# Patient Record
Sex: Female | Born: 1955 | Race: White | Hispanic: No | Marital: Single | Smoking: Current every day smoker
Health system: Southern US, Community
[De-identification: ages and names within clinical notes are randomized; demographics above are authoritative.]

## PROBLEM LIST (undated history)

## (undated) DIAGNOSIS — F419 Anxiety disorder, unspecified: Secondary | ICD-10-CM

## (undated) DIAGNOSIS — I639 Cerebral infarction, unspecified: Secondary | ICD-10-CM

## (undated) DIAGNOSIS — M542 Cervicalgia: Secondary | ICD-10-CM

## (undated) DIAGNOSIS — I6529 Occlusion and stenosis of unspecified carotid artery: Secondary | ICD-10-CM

## (undated) DIAGNOSIS — H539 Unspecified visual disturbance: Secondary | ICD-10-CM

## (undated) DIAGNOSIS — Z720 Tobacco use: Secondary | ICD-10-CM

## (undated) DIAGNOSIS — I10 Essential (primary) hypertension: Secondary | ICD-10-CM

## (undated) DIAGNOSIS — E785 Hyperlipidemia, unspecified: Secondary | ICD-10-CM

## (undated) HISTORY — PX: TONSILLECTOMY: SUR1361

## (undated) HISTORY — DX: Essential (primary) hypertension: I10

## (undated) HISTORY — DX: Cerebral infarction, unspecified: I63.9

## (undated) HISTORY — DX: Unspecified visual disturbance: H53.9

## (undated) HISTORY — PX: TUBAL LIGATION: SHX77

## (undated) HISTORY — DX: Occlusion and stenosis of unspecified carotid artery: I65.29

## (undated) HISTORY — DX: Hyperlipidemia, unspecified: E78.5

## (undated) HISTORY — DX: Tobacco use: Z72.0

## (undated) HISTORY — DX: Cervicalgia: M54.2

## (undated) HISTORY — DX: Anxiety disorder, unspecified: F41.9

---

## 2003-08-04 ENCOUNTER — Other Ambulatory Visit: Admission: RE | Admit: 2003-08-04 | Discharge: 2003-08-04 | Payer: Self-pay | Admitting: Obstetrics and Gynecology

## 2003-08-15 ENCOUNTER — Emergency Department (HOSPITAL_COMMUNITY): Admission: EM | Admit: 2003-08-15 | Discharge: 2003-08-15 | Payer: Self-pay | Admitting: Emergency Medicine

## 2005-11-03 ENCOUNTER — Other Ambulatory Visit: Admission: RE | Admit: 2005-11-03 | Discharge: 2005-11-03 | Payer: Self-pay | Admitting: Obstetrics and Gynecology

## 2009-09-04 DIAGNOSIS — H539 Unspecified visual disturbance: Secondary | ICD-10-CM

## 2009-09-04 HISTORY — DX: Unspecified visual disturbance: H53.9

## 2013-02-02 DIAGNOSIS — I639 Cerebral infarction, unspecified: Secondary | ICD-10-CM

## 2013-02-02 HISTORY — DX: Cerebral infarction, unspecified: I63.9

## 2013-04-04 ENCOUNTER — Encounter: Payer: Self-pay | Admitting: Surgery

## 2013-04-04 ENCOUNTER — Other Ambulatory Visit: Payer: Self-pay | Admitting: *Deleted

## 2013-04-07 ENCOUNTER — Encounter: Payer: Self-pay | Admitting: Surgery

## 2013-04-07 ENCOUNTER — Ambulatory Visit (INDEPENDENT_AMBULATORY_CARE_PROVIDER_SITE_OTHER): Payer: Medicare Other | Admitting: Surgery

## 2013-04-07 ENCOUNTER — Other Ambulatory Visit (INDEPENDENT_AMBULATORY_CARE_PROVIDER_SITE_OTHER): Payer: Medicare Other | Admitting: *Deleted

## 2013-04-07 DIAGNOSIS — I6529 Occlusion and stenosis of unspecified carotid artery: Secondary | ICD-10-CM

## 2013-04-07 NOTE — Progress Notes (Signed)
Vascular and Vein Specialist of [facility]   Patient name: Leah Wolf MRN: [id number] DOB: 08-Sep-1955 Sex: female   Referred by: Dr. Latanya Maudlin  Reason for referral:  Chief Complaint  Patient presents with  . New Evaluation    New bilateral carotid stenosis  referred by Dr Romeo Apple [facility] [place]    HISTORY OF PRESENT ILLNESS: The patient comes in today for evaluation of her carotid disease. She presented to [facility] in early June with symptoms of slurred speech severe headache in vision changes. She was diagnosed with an acute infarct in the left posterior parietal and left occipital lobe. There was diffuse narrowing throughout the left ICA was significant stenosis in the distal left ICA and absence of signal in the left MCA. This was all the MRI. She went on to have a CT scan which showed diffuse abnormality within the left carotid system. There may be an elongated plaque or probable old dissection. He was greater than 90% diameter stenosis of the left common carotid artery and origin of the left internal carotid artery with greater than 99% stenosis. Because of the dissection, the patient was started on anticoagulation. She was also started on a statin for hypercholesterolemia. Since her discharge, she has residual defects in her vision, however these are improved from her initial presentation. She also has trouble with her finding. Her speech is somewhat slurred but improved. She will occasionally get bilateral headaches. She continues to smoke but is trying to stop.  Past Medical History  Diagnosis Date  . Anxiety   . Cervical pain (neck)   . Carotid artery occlusion     left  . Hypertension   . Hyperlipidemia   . Acute CVA (cerebrovascular accident)   . Tobacco abuse     Past Surgical History  Procedure Laterality Date  . Tonsillectomy    . Tubal ligation      History   Social History  . Marital Status: Single    Spouse Name: N/A    Number of  Children: N/A  . Years of Education: N/A   Occupational History  . Not on file.   Social History Main Topics  . Smoking status: Current Every Day Smoker -- 40 years    Types: Cigarettes  . Smokeless tobacco: Never Used  . Alcohol Use: No  . Drug Use: No  . Sexually Active: Not on file   Other Topics Concern  . Not on file   Social History Narrative  . No narrative on file    Family History  Problem Relation Age of Onset  . Heart disease Mother   . Heart disease Brother     Allergies as of 04/07/2013  . (Not on File)    Current Outpatient Prescriptions on File Prior to Visit  Medication Sig Dispense Refill  . aspirin 81 MG tablet Take 81 mg by mouth daily.      Marland Kitchen atorvastatin (LIPITOR) 80 MG tablet Take 80 mg by mouth daily.      Marland Kitchen lisinopril (PRINIVIL,ZESTRIL) 20 MG tablet Take 20 mg by mouth daily.      . Rivaroxaban (XARELTO) 20 MG TABS Take 20 mg by mouth daily.       No current facility-administered medications on file prior to visit.     REVIEW OF SYSTEMS: Please see history of present illness, otherwise all systems are negative  PHYSICAL EXAMINATION: General: The patient appears their stated age.  Vital signs are BP 139/76  Pulse 90  Resp 18  Ht 5\' 4"  (1.626 m)  Wt 165 lb (74.844 kg)  BMI 28.31 kg/m2 HEENT:  No gross abnormalities Pulmonary: Respirations are non-labored Abdomen: Soft and non-tender  Musculoskeletal: There are no major deformities.   Neurologic: Trouble with word finding, slurred speech Skin: There are no ulcer or rashes noted. Psychiatric: The patient has normal affect. Cardiovascular: There is a regular rate and rhythm without significant murmur appreciated. No carotid bruits  Diagnostic Studies: Please see history of present illness for details of her imaging studies. She had a carotid ultrasound which was ordered and reviewed today. This shows a 40-59% bilateral carotid stenosis. There is a less than 50% stenosis within the left  common carotid artery.    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 Since the CT scan mentioned a possibility of dissection, and she was treated with anticoagulation I wonder if she has had some remolding of a dissection which is why her ultrasound findings are not as severe as a CT scan suggested back in June. Before making a decision as to whether or not we should proceed with intervention or what kind of intervention she needs, I feel that I must repeat some for diagnostic imaging. I have elected to repeat her CT angiogram rather than formal angiography because she is on anticoagulation. She will have this done in followup to see me in one week. At that time I will make a decision as to whether or not she needs intervention and what kind of intervention it should be.     Jorge Ny, M.D. [facility] of [place] Office: [phone] Pager:  [phone]

## 2013-04-11 ENCOUNTER — Encounter: Payer: Self-pay | Admitting: Surgery

## 2013-04-14 ENCOUNTER — Ambulatory Visit (INDEPENDENT_AMBULATORY_CARE_PROVIDER_SITE_OTHER): Payer: Medicare Other | Admitting: Surgery

## 2013-04-14 ENCOUNTER — Ambulatory Visit
Admission: RE | Admit: 2013-04-14 | Discharge: 2013-04-14 | Disposition: A | Discharge status: Home / Self Care | Payer: Medicare Other | Source: Ambulatory Visit | Attending: Surgery | Admitting: Surgery

## 2013-04-14 ENCOUNTER — Encounter: Payer: Self-pay | Admitting: *Deleted

## 2013-04-14 ENCOUNTER — Other Ambulatory Visit: Payer: Self-pay | Admitting: *Deleted

## 2013-04-14 ENCOUNTER — Other Ambulatory Visit: Payer: Self-pay | Admitting: Surgery

## 2013-04-14 ENCOUNTER — Encounter: Payer: Self-pay | Admitting: Surgery

## 2013-04-14 DIAGNOSIS — Z01810 Encounter for preprocedural cardiovascular examination: Secondary | ICD-10-CM

## 2013-04-14 DIAGNOSIS — I6529 Occlusion and stenosis of unspecified carotid artery: Secondary | ICD-10-CM

## 2013-04-14 MED ORDER — IOHEXOL 350 MG/ML SOLN
100.0000 mL | Freq: Once | INTRAVENOUS | Status: AC | PRN
  Administered 2013-04-14: 100 mL via INTRAVENOUS

## 2013-04-14 NOTE — Progress Notes (Signed)
The patient comes back today for discussions of her CT scan. Please see note from last week's clinic visit. There have been no interval changes. I spent approximately 30 minutes reviewing the patient's CAT scan and discussing her treatment options with her today.  Her CAT scan today reveals significant stenosis, approximately 90% within the left carotid artery. There is also an area in the proximal left common carotid artery with a 50% stenosis. No significant right carotid stenosis is identified. This does not appear to be a dissection based on today's imaging.  After the above images I discussed proceeding with left carotid endarterectomy. I do not think that she would be a good stenting candidate because of the size mismatch and the small size of her distal internal carotid artery. We have scheduled her left carotid endarterectomy for Thursday, August 21. She will stop her Lesia Hausen on Monday, although she is not taking it consistently because he gets her headaches. In addition she is only periodically taking her statin her statin. We discussed the risks and benefits of surgery which include but are not limited to the risk of stroke, cardiopulmonary complications, nerve injury. She is willing to proceed. At the time of her operation I will need to focus on both the common carotid lesion as well as the internal carotid lesion.

## 2013-04-15 ENCOUNTER — Encounter (HOSPITAL_COMMUNITY): Payer: Self-pay | Admitting: Pharmacy Technician

## 2013-04-15 LAB — CREATININE, SERUM: Creat: 0.64 mg/dL (ref 0.50–1.10)

## 2013-04-15 LAB — BUN: BUN: 12 mg/dL (ref 6–23)

## 2013-04-16 NOTE — Addendum Note (Signed)
Addended by: Sharee Pimple on: 04/16/2013 01:18 PM   Modules accepted: Orders

## 2013-04-21 ENCOUNTER — Ambulatory Visit (HOSPITAL_BASED_OUTPATIENT_CLINIC_OR_DEPARTMENT_OTHER): Payer: Medicare Other | Admitting: Radiology

## 2013-04-21 ENCOUNTER — Ambulatory Visit (HOSPITAL_COMMUNITY)
Admission: RE | Admit: 2013-04-21 | Discharge: 2013-04-21 | Disposition: A | Discharge status: Home / Self Care | Payer: Medicare Other | Source: Ambulatory Visit | Attending: Anesthesiology | Admitting: Anesthesiology

## 2013-04-21 ENCOUNTER — Encounter (HOSPITAL_COMMUNITY)
Admission: RE | Admit: 2013-04-21 | Discharge: 2013-04-21 | Disposition: A | Discharge status: Home / Self Care | Payer: Medicare Other | Source: Ambulatory Visit | Attending: Surgery | Admitting: Surgery

## 2013-04-21 DIAGNOSIS — Z01812 Encounter for preprocedural laboratory examination: Secondary | ICD-10-CM | POA: Insufficient documentation

## 2013-04-21 DIAGNOSIS — R0602 Shortness of breath: Secondary | ICD-10-CM

## 2013-04-21 DIAGNOSIS — Z01818 Encounter for other preprocedural examination: Secondary | ICD-10-CM | POA: Insufficient documentation

## 2013-04-21 DIAGNOSIS — Z01810 Encounter for preprocedural cardiovascular examination: Secondary | ICD-10-CM

## 2013-04-21 DIAGNOSIS — I10 Essential (primary) hypertension: Secondary | ICD-10-CM | POA: Insufficient documentation

## 2013-04-21 LAB — COMPREHENSIVE METABOLIC PANEL
ALT: 32 U/L (ref 0–35)
Calcium: 10 mg/dL (ref 8.4–10.5)
GFR calc Af Amer: >90 mL/min (ref >90)
Glucose, Bld: 112 mg/dL — ABNORMAL HIGH (ref 70–99)
Sodium: 137 mEq/L (ref 135–145)
Total Protein: 7.8 g/dL (ref 6.0–8.3)

## 2013-04-21 LAB — CBC
Hemoglobin: 15.2 g/dL — ABNORMAL HIGH (ref 12.0–15.0)
MCH: 31.6 pg (ref 26.0–34.0)
MCHC: 34.9 g/dL (ref 30.0–36.0)

## 2013-04-21 LAB — URINALYSIS, ROUTINE W REFLEX MICROSCOPIC
Bilirubin Urine: NEGATIVE
Glucose, UA: NEGATIVE mg/dL
Hgb urine dipstick: NEGATIVE
Ketones, ur: NEGATIVE mg/dL
Nitrite: NEGATIVE
Protein, ur: NEGATIVE mg/dL
Specific Gravity, Urine: 1.019 (ref 1.005–1.030)
Urobilinogen, UA: 1 mg/dL (ref 0.0–1.0)
pH: 6.5 (ref 5.0–8.0)

## 2013-04-21 LAB — ABO/RH: ABO/RH(D): A POS

## 2013-04-21 LAB — TYPE AND SCREEN
ABO/RH(D): A POS
Antibody Screen: NEGATIVE

## 2013-04-21 LAB — SURGICAL PCR SCREEN
MRSA, PCR: NEGATIVE
Staphylococcus aureus: NEGATIVE

## 2013-04-21 LAB — APTT: aPTT: 25 s (ref 24–37)

## 2013-04-21 LAB — URINE MICROSCOPIC-ADD ON

## 2013-04-21 LAB — PROTIME-INR: Prothrombin Time: 12.4 seconds (ref 11.6–15.2)

## 2013-04-21 MED ORDER — TECHNETIUM TC 99M SESTAMIBI GENERIC - CARDIOLITE
33.0000 | Freq: Once | INTRAVENOUS | Status: AC | PRN
  Administered 2013-04-21: 33 via INTRAVENOUS

## 2013-04-21 MED ORDER — TECHNETIUM TC 99M SESTAMIBI GENERIC - CARDIOLITE
11.0000 | Freq: Once | INTRAVENOUS | Status: AC | PRN
  Administered 2013-04-21: 11 via INTRAVENOUS

## 2013-04-21 MED ORDER — REGADENOSON 0.4 MG/5ML IV SOLN
0.4000 mg | Freq: Once | INTRAVENOUS | Status: AC
  Administered 2013-04-21: 0.4 mg via INTRAVENOUS

## 2013-04-21 NOTE — Pre-Procedure Instructions (Signed)
Leah Wolf  04/21/2013   Your procedure is scheduled on:  Thursday, April 24, 2013  Report to [facility] at 7:30 AM.  Call this number if you have problems the morning of surgery: [phone]   Remember:   Do not eat food or drink liquids after midnight.   Take these medicines the morning of surgery with A SIP OF WATER: LORazepam (ATIVAN) 2 MG tablet   Do not wear jewelry, make-up or nail polish.  Do not wear lotions, powders, or perfumes. You may wear deodorant.  Do not shave 48 hours prior to surgery. Men may shave face and neck.  Do not bring valuables to the hospital.  [facility] is not responsible  for any belongings or valuables.  Contacts, dentures or bridgework may not be worn into surgery.  Leave suitcase in the car. After surgery it may be brought to your room.  For patients admitted to the hospital, checkout time is 11:00 AM the day of discharge.   Patients discharged the day of surgery will not be allowed to drive home.  Name and phone number of your driver:   Special Instructions: Shower using CHG 2 nights before surgery and the night before surgery.  If you shower the day of surgery use CHG.  Use special wash - you have one bottle of CHG for all showers.  You should use approximately 1/3 of the bottle for each shower.   Please read over the following fact sheets that you were given: Pain Booklet, Coughing and Deep Breathing, Blood Transfusion Information, MRSA Information and Surgical Site Infection Prevention

## 2013-04-21 NOTE — Progress Notes (Signed)
[facility] SITE 3 NUCLEAR MED [address] [place], [place] [address] [phone]    Cardiology Nuclear Med Study  Leah Wolf is a 57 y.o. female     MRN : [id number]     DOB: Apr 05, 1956  Procedure Date: 04/21/2013  Nuclear Med Background Indication for Stress Test:  Evaluation for Ischemia, and Surgical Clearance for  (L) Carotid Endarterectomy on 04-24-13 by Dr.Vance Brabham  History:  No prior known history of CAD, 04-2013: CT Carotid 90% (L) ICA Cardiac Risk Factors: Carotid Disease, CVA, Family History - CAD, Hypertension, Lipids and Smoker  Symptoms: Upper back pain with indigestion, Diaphoresis, Dizziness, DOE, Light-Headedness and Rapid HR   Nuclear Pre-Procedure Caffeine/Decaff Intake:  None> 12 hrs NPO After: 8:00pm   Lungs:  clear O2 Sat: 97% on room air. IV 0.9% NS with Angio Cath:  22g  IV Site: R Wrist x 1, tolerated well IV Started by:  Irean Hong, RN  Chest Size (in):  40 Cup Size: D  Height: 5\' 4"  (1.626 m)  Weight:  164 lb (74.39 kg)  BMI:  Body mass index is 28.14 kg/(m^2). Tech Comments:  Took Lisinopril and Ativan this am    Nuclear Med Study 1 or 2 day study: 1 day  Stress Test Type:  Lexiscan  Reading MD: Willa Rough, MD  Order Authorizing Provider:  Coral Else, MD  Resting Radionuclide: Technetium 80m Sestamibi  Resting Radionuclide Dose: 11.0 mCi   Stress Radionuclide:  Technetium 85m Sestamibi  Stress Radionuclide Dose: 33.0 mCi           Stress Protocol Rest HR: 84 Stress HR: 122  Rest BP: 94/62 Stress BP: 160/69  Exercise Time (min): n/a METS: n/a   Predicted Max HR: 162 bpm % Max HR: 75.31 bpm Rate Pressure Product: 40102   Dose of Adenosine (mg):  n/a Dose of Lexiscan: 0.4 mg  Dose of Atropine (mg): n/a Dose of Dobutamine: n/a mcg/kg/min (at max HR)  Stress Test Technologist: Irean Hong, RN  Nuclear Technologist:  Doyne Keel, CNMT     Rest Procedure:  Myocardial perfusion imaging was performed at rest 45  minutes following the intravenous administration of Technetium 88m Sestamibi. Rest ECG: NSR - Normal EKG  Stress Procedure:  The patient received IV Lexiscan 0.4 mg over 15-seconds.  Technetium 20m Sestamibi injected at 30-seconds. The patient complained of increase heart rate, and headache with Lexiscan, but denied chest pain. Quantitative spect images were obtained after a 45 minute delay. Stress ECG: No significant change from baseline ECG  QPS Raw Data Images:  Normal; no motion artifact; normal heart/lung ratio. Stress Images:  Normal homogeneous uptake in all areas of the myocardium. Rest Images:  Normal homogeneous uptake in all areas of the myocardium. Subtraction (SDS):  No evidence of ischemia. Transient Ischemic Dilatation (Normal <1.22):  NA Lung/Heart Ratio (Normal <0.45):  0.40  Quantitative Gated Spect Images QGS EDV:  39 ml QGS ESV:  10 ml  Impression Exercise Capacity:  Lexiscan with no exercise. BP Response:  Normal blood pressure response. Clinical Symptoms:  No significant symptoms noted. ECG Impression:  No significant ST segment change suggestive of ischemia. Comparison with Prior Nuclear Study: No previous nuclear study performed  Overall Impression:  Normal stress nuclear study. There no significant abnormalities. There is no scar or ischemia. This is a low-risk scan.  LV Ejection Fraction: 73%.  LV Wall Motion:  Normal Wall Motion.  Willa Rough, MD

## 2013-04-23 ENCOUNTER — Encounter: Payer: Self-pay | Admitting: Psychiatry

## 2013-04-23 MED ORDER — DEXTROSE 5 % IV SOLN
1.5000 g | INTRAVENOUS | Status: AC
  Administered 2013-04-24: 1.5 g via INTRAVENOUS
  Filled 2013-04-23: qty 1.5

## 2013-04-24 ENCOUNTER — Inpatient Hospital Stay (HOSPITAL_COMMUNITY)
Admission: RE | Admit: 2013-04-24 | Discharge: 2013-04-25 | DRG: 039 | Disposition: A | Discharge status: Home / Self Care | Payer: Medicare Other | Source: Ambulatory Visit | Attending: Surgery | Admitting: Surgery

## 2013-04-24 ENCOUNTER — Encounter (HOSPITAL_COMMUNITY): Payer: Self-pay | Admitting: *Deleted

## 2013-04-24 ENCOUNTER — Encounter (HOSPITAL_COMMUNITY)
Admission: RE | Disposition: A | Discharge status: Home / Self Care | Payer: Self-pay | Source: Ambulatory Visit | Attending: Surgery

## 2013-04-24 ENCOUNTER — Encounter (HOSPITAL_COMMUNITY): Payer: Self-pay | Admitting: Certified Registered"

## 2013-04-24 ENCOUNTER — Inpatient Hospital Stay (HOSPITAL_COMMUNITY): Payer: Medicare Other | Admitting: Certified Registered"

## 2013-04-24 DIAGNOSIS — Z79899 Other long term (current) drug therapy: Secondary | ICD-10-CM

## 2013-04-24 DIAGNOSIS — I10 Essential (primary) hypertension: Secondary | ICD-10-CM | POA: Diagnosis present

## 2013-04-24 DIAGNOSIS — E785 Hyperlipidemia, unspecified: Secondary | ICD-10-CM | POA: Diagnosis present

## 2013-04-24 DIAGNOSIS — I658 Occlusion and stenosis of other precerebral arteries: Secondary | ICD-10-CM | POA: Diagnosis present

## 2013-04-24 DIAGNOSIS — Z7982 Long term (current) use of aspirin: Secondary | ICD-10-CM

## 2013-04-24 DIAGNOSIS — I6529 Occlusion and stenosis of unspecified carotid artery: Secondary | ICD-10-CM

## 2013-04-24 DIAGNOSIS — F17200 Nicotine dependence, unspecified, uncomplicated: Secondary | ICD-10-CM | POA: Diagnosis present

## 2013-04-24 DIAGNOSIS — F411 Generalized anxiety disorder: Secondary | ICD-10-CM | POA: Diagnosis present

## 2013-04-24 DIAGNOSIS — I69928 Other speech and language deficits following unspecified cerebrovascular disease: Secondary | ICD-10-CM

## 2013-04-24 HISTORY — PX: ENDARTERECTOMY: SHX5162

## 2013-04-24 LAB — CBC
MCH: 30.8 pg (ref 26.0–34.0)
MCV: 91.2 fL (ref 78.0–100.0)
Platelets: 163 10*3/uL (ref 150–400)
RBC: 3.77 MIL/uL — ABNORMAL LOW (ref 3.87–5.11)
RDW: 14.2 % (ref 11.5–15.5)
WBC: 6.5 10*3/uL (ref 4.0–10.5)

## 2013-04-24 LAB — CREATININE, SERUM
Creatinine, Ser: 0.64 mg/dL (ref 0.50–1.10)
GFR calc Af Amer: >90 mL/min (ref >90)

## 2013-04-24 SURGERY — ENDARTERECTOMY, CAROTID
Anesthesia: General | Site: Neck | Laterality: Left | Wound class: Clean

## 2013-04-24 MED ORDER — HYDROMORPHONE HCL PF 1 MG/ML IJ SOLN
INTRAMUSCULAR | Status: AC
  Filled 2013-04-24: qty 1

## 2013-04-24 MED ORDER — SODIUM CHLORIDE 0.9 % IV SOLN: INTRAVENOUS | Status: DC

## 2013-04-24 MED ORDER — ENOXAPARIN SODIUM 30 MG/0.3ML ~~LOC~~ SOLN
30.0000 mg | SUBCUTANEOUS | Status: DC
  Filled 2013-04-24: qty 0.3

## 2013-04-24 MED ORDER — HYDROMORPHONE HCL PF 1 MG/ML IJ SOLN
0.2500 mg | INTRAMUSCULAR | Status: DC | PRN
Start: 2013-04-24 — End: 2013-04-24
  Administered 2013-04-24 (×4): 0.5 mg via INTRAVENOUS

## 2013-04-24 MED ORDER — ATORVASTATIN CALCIUM 80 MG PO TABS
80.0000 mg | ORAL_TABLET | Freq: Every day | ORAL | Status: DC
  Administered 2013-04-24: 80 mg via ORAL
  Filled 2013-04-24 (×2): qty 1

## 2013-04-24 MED ORDER — MORPHINE SULFATE 2 MG/ML IJ SOLN
2.0000 mg | INTRAMUSCULAR | Status: DC | PRN
  Administered 2013-04-24: 2 mg via INTRAVENOUS
  Filled 2013-04-24: qty 1

## 2013-04-24 MED ORDER — LIDOCAINE HCL (PF) 1 % IJ SOLN
INTRAMUSCULAR | Status: AC
  Filled 2013-04-24: qty 30

## 2013-04-24 MED ORDER — ONDANSETRON HCL 4 MG/2ML IJ SOLN
INTRAMUSCULAR | Status: AC
  Filled 2013-04-24: qty 2

## 2013-04-24 MED ORDER — ACETAMINOPHEN 325 MG PO TABS: 325.0000 mg | ORAL_TABLET | ORAL | Status: DC | PRN

## 2013-04-24 MED ORDER — METOPROLOL TARTRATE 1 MG/ML IV SOLN: 2.0000 mg | INTRAVENOUS | Status: DC | PRN

## 2013-04-24 MED ORDER — ASPIRIN EC 81 MG PO TBEC
81.0000 mg | DELAYED_RELEASE_TABLET | Freq: Every day | ORAL | Status: DC
  Administered 2013-04-24: 81 mg via ORAL
  Filled 2013-04-24 (×2): qty 1

## 2013-04-24 MED ORDER — ROCURONIUM BROMIDE 100 MG/10ML IV SOLN
INTRAVENOUS | Status: DC | PRN
  Administered 2013-04-24: 35 mg via INTRAVENOUS

## 2013-04-24 MED ORDER — LIDOCAINE HCL (CARDIAC) 20 MG/ML IV SOLN
INTRAVENOUS | Status: DC | PRN
  Administered 2013-04-24: 90 mg via INTRAVENOUS

## 2013-04-24 MED ORDER — PHENOL 1.4 % MT LIQD: 1.0000 | OROMUCOSAL | Status: DC | PRN

## 2013-04-24 MED ORDER — DOCUSATE SODIUM 100 MG PO CAPS: 100.0000 mg | ORAL_CAPSULE | Freq: Every day | ORAL | Status: DC

## 2013-04-24 MED ORDER — POTASSIUM CHLORIDE CRYS ER 20 MEQ PO TBCR: 20.0000 meq | EXTENDED_RELEASE_TABLET | Freq: Once | ORAL | Status: AC | PRN

## 2013-04-24 MED ORDER — ENOXAPARIN SODIUM 30 MG/0.3ML ~~LOC~~ SOLN: 30.0000 mg | SUBCUTANEOUS | Status: DC

## 2013-04-24 MED ORDER — BISACODYL 10 MG RE SUPP: 10.0000 mg | Freq: Every day | RECTAL | Status: DC | PRN

## 2013-04-24 MED ORDER — ACETAMINOPHEN 650 MG RE SUPP: 325.0000 mg | RECTAL | Status: DC | PRN

## 2013-04-24 MED ORDER — DOPAMINE-DEXTROSE 3.2-5 MG/ML-% IV SOLN: 3.0000 ug/kg/min | INTRAVENOUS | Status: DC

## 2013-04-24 MED ORDER — SUFENTANIL CITRATE 50 MCG/ML IV SOLN
INTRAVENOUS | Status: DC | PRN
  Administered 2013-04-24 (×2): 10 ug via INTRAVENOUS

## 2013-04-24 MED ORDER — HEMOSTATIC AGENTS (NO CHARGE) OPTIME
TOPICAL | Status: DC | PRN
  Administered 2013-04-24: 1 via TOPICAL

## 2013-04-24 MED ORDER — PROTAMINE SULFATE 10 MG/ML IV SOLN
INTRAVENOUS | Status: DC | PRN
  Administered 2013-04-24: 50 mg via INTRAVENOUS

## 2013-04-24 MED ORDER — HYDRALAZINE HCL 20 MG/ML IJ SOLN: 10.0000 mg | INTRAMUSCULAR | Status: DC | PRN

## 2013-04-24 MED ORDER — OXYCODONE-ACETAMINOPHEN 5-325 MG PO TABS
1.0000 | ORAL_TABLET | ORAL | Status: DC | PRN
  Administered 2013-04-24: 2 via ORAL

## 2013-04-24 MED ORDER — LACTATED RINGERS IV SOLN
INTRAVENOUS | Status: DC | PRN
  Administered 2013-04-24: 09:00:00 via INTRAVENOUS

## 2013-04-24 MED ORDER — ONDANSETRON HCL 4 MG/2ML IJ SOLN
INTRAMUSCULAR | Status: DC | PRN
  Administered 2013-04-24: 4 mg via INTRAVENOUS

## 2013-04-24 MED ORDER — SODIUM CHLORIDE 0.9 % IR SOLN
Status: DC | PRN
  Administered 2013-04-24: 11:00:00

## 2013-04-24 MED ORDER — SODIUM CHLORIDE 0.9 % IV SOLN
INTRAVENOUS | Status: DC
  Administered 2013-04-24 – 2013-04-25 (×3): via INTRAVENOUS

## 2013-04-24 MED ORDER — SODIUM CHLORIDE 0.9 % IV SOLN
10.0000 mg | INTRAVENOUS | Status: DC | PRN
  Administered 2013-04-24: 30 ug/min via INTRAVENOUS

## 2013-04-24 MED ORDER — MAGNESIUM SULFATE 40 MG/ML IJ SOLN
2.0000 g | Freq: Once | INTRAMUSCULAR | Status: AC | PRN
  Filled 2013-04-24: qty 50

## 2013-04-24 MED ORDER — PROPOFOL 10 MG/ML IV BOLUS
INTRAVENOUS | Status: DC | PRN
  Administered 2013-04-24: 200 mg via INTRAVENOUS

## 2013-04-24 MED ORDER — OXYCODONE-ACETAMINOPHEN 5-325 MG PO TABS
ORAL_TABLET | ORAL | Status: AC
  Filled 2013-04-24: qty 2

## 2013-04-24 MED ORDER — ONDANSETRON HCL 4 MG/2ML IJ SOLN
4.0000 mg | Freq: Four times a day (QID) | INTRAMUSCULAR | Status: DC | PRN
  Filled 2013-04-24: qty 2

## 2013-04-24 MED ORDER — 0.9 % SODIUM CHLORIDE (POUR BTL) OPTIME
TOPICAL | Status: DC | PRN
  Administered 2013-04-24: 2000 mL

## 2013-04-24 MED ORDER — ENOXAPARIN SODIUM 30 MG/0.3ML ~~LOC~~ SOLN
30.0000 mg | SUBCUTANEOUS | Status: DC
Start: 2013-04-25 — End: 2013-04-25
  Administered 2013-04-25: 30 mg via SUBCUTANEOUS
  Filled 2013-04-24 (×2): qty 0.3

## 2013-04-24 MED ORDER — ALUM & MAG HYDROXIDE-SIMETH 200-200-20 MG/5ML PO SUSP: 15.0000 mL | ORAL | Status: DC | PRN

## 2013-04-24 MED ORDER — LACTATED RINGERS IV SOLN
Freq: Once | INTRAVENOUS | Status: AC
  Administered 2013-04-24: 09:00:00 via INTRAVENOUS

## 2013-04-24 MED ORDER — ONDANSETRON HCL 4 MG/2ML IJ SOLN
4.0000 mg | Freq: Once | INTRAMUSCULAR | Status: AC | PRN
  Administered 2013-04-24: 4 mg via INTRAVENOUS

## 2013-04-24 MED ORDER — LABETALOL HCL 5 MG/ML IV SOLN: 10.0000 mg | INTRAVENOUS | Status: DC | PRN

## 2013-04-24 MED ORDER — DEXTROSE 5 % IV SOLN
1.5000 g | Freq: Two times a day (BID) | INTRAVENOUS | Status: AC
  Administered 2013-04-24 – 2013-04-25 (×2): 1.5 g via INTRAVENOUS
  Filled 2013-04-24 (×2): qty 1.5

## 2013-04-24 MED ORDER — SODIUM CHLORIDE 0.9 % IV SOLN
500.0000 mL | Freq: Once | INTRAVENOUS | Status: AC | PRN
  Administered 2013-04-24: 500 mL via INTRAVENOUS

## 2013-04-24 MED ORDER — LABETALOL HCL 5 MG/ML IV SOLN
INTRAVENOUS | Status: DC | PRN
  Administered 2013-04-24: 10 mg via INTRAVENOUS

## 2013-04-24 MED ORDER — LISINOPRIL 20 MG PO TABS
20.0000 mg | ORAL_TABLET | Freq: Every day | ORAL | Status: DC
  Filled 2013-04-24 (×2): qty 1

## 2013-04-24 MED ORDER — LORAZEPAM 1 MG PO TABS
2.0000 mg | ORAL_TABLET | ORAL | Status: DC | PRN
  Administered 2013-04-24 (×2): 2 mg via ORAL
  Administered 2013-04-24: 1 mg via ORAL
  Administered 2013-04-25: 2 mg via ORAL
  Filled 2013-04-24 (×4): qty 2

## 2013-04-24 MED ORDER — GUAIFENESIN-DM 100-10 MG/5ML PO SYRP: 15.0000 mL | ORAL_SOLUTION | ORAL | Status: DC | PRN

## 2013-04-24 MED ORDER — PANTOPRAZOLE SODIUM 40 MG PO TBEC: 40.0000 mg | DELAYED_RELEASE_TABLET | Freq: Every day | ORAL | Status: DC

## 2013-04-24 MED ORDER — SENNOSIDES-DOCUSATE SODIUM 8.6-50 MG PO TABS
1.0000 | ORAL_TABLET | Freq: Every evening | ORAL | Status: DC | PRN
  Filled 2013-04-24: qty 1

## 2013-04-24 MED ORDER — HEPARIN SODIUM (PORCINE) 1000 UNIT/ML IJ SOLN
INTRAMUSCULAR | Status: DC | PRN
  Administered 2013-04-24: 6000 [IU] via INTRAVENOUS
  Administered 2013-04-24: 1000 [IU] via INTRAVENOUS

## 2013-04-24 SURGICAL SUPPLY — 53 items
ADH SKN CLS APL DERMABOND .7 (GAUZE/BANDAGES/DRESSINGS) ×4
CANISTER SUCTION 2500CC (MISCELLANEOUS) ×2 IMPLANT
CATH ROBINSON RED A/P 18FR (CATHETERS) ×2 IMPLANT
CATH SUCT 10FR WHISTLE TIP (CATHETERS) ×2 IMPLANT
CLIP TI MEDIUM 24 (CLIP) ×2 IMPLANT
CLIP TI WIDE RED SMALL 24 (CLIP) ×2 IMPLANT
CLOTH BEACON ORANGE TIMEOUT ST (SAFETY) ×2 IMPLANT
COVER SURGICAL LIGHT HANDLE (MISCELLANEOUS) ×2 IMPLANT
CRADLE DONUT ADULT HEAD (MISCELLANEOUS) ×2 IMPLANT
DERMABOND ADVANCED (GAUZE/BANDAGES/DRESSINGS) ×4
DERMABOND ADVANCED .7 DNX12 (GAUZE/BANDAGES/DRESSINGS) ×1 IMPLANT
DRAIN CHANNEL 15F RND FF W/TCR (WOUND CARE) IMPLANT
DRAPE WARM FLUID 44X44 (DRAPE) ×2 IMPLANT
ELECT REM PT RETURN 9FT ADLT (ELECTROSURGICAL) ×2
ELECTRODE REM PT RTRN 9FT ADLT (ELECTROSURGICAL) ×1 IMPLANT
EVACUATOR SILICONE 100CC (DRAIN) IMPLANT
GLOVE BIO SURGEON STRL SZ 6.5 (GLOVE) ×1 IMPLANT
GLOVE BIO SURGEON STRL SZ7.5 (GLOVE) ×1 IMPLANT
GLOVE BIOGEL PI IND STRL 6.5 (GLOVE) IMPLANT
GLOVE BIOGEL PI IND STRL 7.5 (GLOVE) ×1 IMPLANT
GLOVE BIOGEL PI INDICATOR 6.5 (GLOVE) ×2
GLOVE BIOGEL PI INDICATOR 7.5 (GLOVE) ×4
GLOVE SS BIOGEL STRL SZ 7 (GLOVE) IMPLANT
GLOVE SS BIOGEL STRL SZ 7.5 (GLOVE) IMPLANT
GLOVE SUPERSENSE BIOGEL SZ 7 (GLOVE) ×1
GLOVE SUPERSENSE BIOGEL SZ 7.5 (GLOVE) ×1
GLOVE SURG SS PI 7.5 STRL IVOR (GLOVE) ×2 IMPLANT
GOWN PREVENTION PLUS XXLARGE (GOWN DISPOSABLE) ×2 IMPLANT
GOWN STRL NON-REIN LRG LVL3 (GOWN DISPOSABLE) ×7 IMPLANT
HEMOSTAT SNOW SURGICEL 2X4 (HEMOSTASIS) ×1 IMPLANT
INSERT FOGARTY SM (MISCELLANEOUS) IMPLANT
KIT BASIN OR (CUSTOM PROCEDURE TRAY) ×2 IMPLANT
KIT ROOM TURNOVER OR (KITS) ×2 IMPLANT
NS IRRIG 1000ML POUR BTL (IV SOLUTION) ×4 IMPLANT
PACK CAROTID (CUSTOM PROCEDURE TRAY) ×2 IMPLANT
PAD ARMBOARD 7.5X6 YLW CONV (MISCELLANEOUS) ×4 IMPLANT
PATCH VASCULAR VASCU GUARD 1X6 (Vascular Products) ×1 IMPLANT
SHUNT CAROTID BYPASS 10 (VASCULAR PRODUCTS) IMPLANT
SHUNT CAROTID BYPASS 12FRX15.5 (VASCULAR PRODUCTS) IMPLANT
SPONGE INTESTINAL PEANUT (DISPOSABLE) ×1 IMPLANT
SUT ETHILON 3 0 PS 1 (SUTURE) IMPLANT
SUT PROLENE 6 0 BV (SUTURE) ×2 IMPLANT
SUT PROLENE 7 0 BV 1 (SUTURE) ×1 IMPLANT
SUT PROLENE 7 0 BV1 MDA (SUTURE) ×1 IMPLANT
SUT SILK 3 0 TIES 17X18 (SUTURE)
SUT SILK 3-0 18XBRD TIE BLK (SUTURE) IMPLANT
SUT VIC AB 3-0 SH 27 (SUTURE) ×4
SUT VIC AB 3-0 SH 27X BRD (SUTURE) ×2 IMPLANT
SUT VICRYL 4-0 PS2 18IN ABS (SUTURE) ×2 IMPLANT
TOWEL OR 17X24 6PK STRL BLUE (TOWEL DISPOSABLE) ×2 IMPLANT
TOWEL OR 17X26 10 PK STRL BLUE (TOWEL DISPOSABLE) ×2 IMPLANT
TUBE FEEDING 8FR 16IN STR KANG (MISCELLANEOUS) ×1 IMPLANT
WATER STERILE IRR 1000ML POUR (IV SOLUTION) ×2 IMPLANT

## 2013-04-24 NOTE — Progress Notes (Signed)
Pt remains anxious; admin bolus for BP. Will continue to monitor.

## 2013-04-24 NOTE — Transfer of Care (Signed)
Immediate Anesthesia Transfer of Care Note  Patient: Leah Wolf  Procedure(s) Performed: Procedure(s): ENDARTERECTOMY CAROTID with patch angioplasty (Left)  Patient Location: PACU  Anesthesia Type:General  Level of Consciousness: awake, alert  and oriented  Airway & Oxygen Therapy: Patient Spontanous Breathing and Patient connected to nasal cannula oxygen  Post-op Assessment: Report given to PACU RN, Post -op Vital signs reviewed and stable and Patient moving all extremities  Post vital signs: Reviewed and stable  Complications: No apparent anesthesia complications

## 2013-04-24 NOTE — Interval H&P Note (Signed)
History and Physical Interval Note:  04/24/2013 9:42 AM  Sharren Bridge  has presented today for surgery, with the diagnosis of LEFT ICA STENOSIS  The various methods of treatment have been discussed with the patient and family. After consideration of risks, benefits and other options for treatment, the patient has consented to  Procedure(s): ENDARTERECTOMY CAROTID (Left) as a surgical intervention .  The patient's history has been reviewed, patient examined, no change in status, stable for surgery.  I have reviewed the patient's chart and labs.  Questions were answered to the patient's satisfaction.     Nikai Quest IV, Lala Lund  The patient comes back today for discussions of her CT scan. Please see note from last week's clinic visit. There have been no interval changes. I spent approximately 30 minutes reviewing the patient's CAT scan and discussing her treatment options with her today.  Her CAT scan today reveals significant stenosis, approximately 90% within the left carotid artery. There is also an area in the proximal left common carotid artery with a 50% stenosis. No significant right carotid stenosis is identified. This does not appear to be a dissection based on today's imaging.  After the above images I discussed proceeding with left carotid endarterectomy. I do not think that she would be a good stenting candidate because of the size mismatch and the small size of her distal internal carotid artery. We have scheduled her left carotid endarterectomy for Thursday, August 21. She will stop her Lesia Hausen on Monday, although she is not taking it consistently because he gets her headaches. In addition she is only periodically taking her statin her statin. We discussed the risks and benefits of surgery which include but are not limited to the risk of stroke, cardiopulmonary complications, nerve injury. She is willing to proceed. At the time of her operation I will need to focus on both the common carotid  lesion as well as the internal carotid lesion.

## 2013-04-24 NOTE — Progress Notes (Signed)
Utilization review completed.  

## 2013-04-24 NOTE — Preoperative (Signed)
Beta Blockers   Reason not to administer Beta Blockers:Not Applicable 

## 2013-04-24 NOTE — Anesthesia Postprocedure Evaluation (Signed)
  Anesthesia Post-op Note  Patient: Leah Wolf  Procedure(s) Performed: Procedure(s): ENDARTERECTOMY CAROTID with patch angioplasty (Left)  Patient Location: PACU  Anesthesia Type:General  Level of Consciousness: awake, alert , oriented and patient cooperative  Airway and Oxygen Therapy: Patient Spontanous Breathing  Post-op Pain: none  Post-op Assessment: Post-op Vital signs reviewed, Patient's Cardiovascular Status Stable, Respiratory Function Stable, Patent Airway, No signs of Nausea or vomiting and Pain level controlled  Post-op Vital Signs: stable  Complications: No apparent anesthesia complications

## 2013-04-24 NOTE — Progress Notes (Signed)
Pt arrived from PACU, VSS, c/o R hand discomfort. Removed arm board, relieved pain somewhat. Will continue to monitor.

## 2013-04-24 NOTE — OR Nursing (Signed)
Patient displayed equal bilateral strength in hands and feet, tongue midline; preop and post op. Slurred speech pre op and post op.

## 2013-04-24 NOTE — Op Note (Signed)
Vascular and Vein Specialists of [facility]  Patient name: Leah Wolf MRN: [id number] DOB: 01-16-1956 Sex: female  04/24/2013 Pre-operative Diagnosis: Symptomatic   left carotid stenosis Post-operative diagnosis:  Same Surgeon:  Jorge Ny Assistants:  Narda Amber Procedure:    left carotid Endarterectomy with bovine pericardial patch angioplasty Anesthesia:  General Blood Loss:  See anesthesia record Specimens:  Carotid Plaque to pathology  Findings:  90 %stenosis; Thrombus:  none  Indications:  This is a 57 year old female who had a stroke in June. No intervention was performed at that time. She was referred to the [facility] [place] but elected to come to [place]. I saw her approximately 2 weeks ago. I ordered a CT angiogram which showed a high-grade stenosis in her left carotid artery. Because of the diameter of her internal carotid artery, I did not feel she was a stent candidate due to the small size. She therefore comes in for endarterectomy.  Procedure:  The patient was identified in the holding area and taken to [facility] OR ROOM 11  The patient was then placed supine on the table.   General endotrachial anesthesia was administered.  The patient was prepped and draped in the usual sterile fashion.  A time out was called and antibiotics were administered.  The incision was made along the anterior border of the left sternocleidomastoid muscle.  Cautery was used to dissect through the subcutaneous tissue.  The platysma muscle was divided with cautery.  The internal jugular vein was exposed along its anterior medial border.  The common facial vein was exposed and then divided between 2-0 silk ties and metal clips.  The common carotid artery was then circumferentially exposed and encircled with an umbilical tape.  The vagus nerve was identified and protected.  Next sharp dissection was used to expose the external carotid artery and the superior thyroid artery.  The were encircled  with a blue vessel loop and a 2-0 silk tie respectively.  Finally, the internal carotid was carefully dissected free.  An umbilical tape was placed around the internal carotid artery distal to the diseased segment.  The hypoglossal nerve was visualized throughout and protected.  The patient was given systemic heparinization.  A bovine carotid patch was selected and prepared on the back table.  A 10 french and 8 [place] shunt were also prepared.  After blood pressure readings were appropriate and the heparin had been given time to circulate, the internal carotid artery was occluded with a baby Gregory clamp.  The external and common carotid arteries were then occluded with vascular clamps and the 2-0 tie tightened on the superior thyroid artery.  A #11 blade was used to make an arteriotomy in the common carotid artery.  This was extended with Potts scissors along the anterior and lateral border of the common and internal carotid artery.  Approximately 90% stenosis was identified.  There was no thrombus identified.  I tried to place a 52 [place] and an 8 [place] shunt but because of the small size of the artery I could not get the shunt to go into the vessel. There was moderate backbleeding. I did not feel I had any other option but to proceed without a shunt..  A kleiner kuntz elevator was used to perform endarterectomy.  An eversion endarterectomy was performed in the external carotid artery.  A good distal endpoint was obtained in the internal carotid artery.  The specimen was removed and sent to pathology.  Heparinized saline was used to irrigate  the endarterectomized field.  All potential embolic debris was removed.  Bovine pericardial patch angioplasty was then performed using a running 6-0 Prolene.  The common internal and external carotid arteries were all appropriately flushed. The artery was again irrigated with heparin saline.  The anastomosis was then secured. The clamp was first released on the external  carotid artery followed by the common carotid artery approximately 30 seconds later, bloodflow was reestablish through the internal carotid artery.  Next, a hand-held Doppler was used to evaluate the signals in the common, external, and internal  carotid arteries, all of which had appropriate signals. I then administered 50 mg protamine. The wound was then irrigated.  After hemostasis was achieved, the carotid sheath was reapproximated with 3-0 Vicryl. The platysma muscle was reapproximated with running 3-0 Vicryl. The skin was closed with 4-0 Vicryl. Dermabond was placed on the skin. The patient was then successfully extubated. Her neurologic exam was similar to her preprocedural exam.The patient was then taken to recovery room in stable condition. There were no complications.     Disposition:  To PACU in stable condition.  Relevant Operative Details:  The patient had an extremely small internal carotid artery. I tried to advance an 74 [place] shunt however this wasn't too snug to advance. I felt it would be dangerous to place a shunt and therefore I did not. She had moderate backbleeding. A focal stenosis within the bifurcation extending into the internal carotid artery was identified. This was approximately 90%.  Juleen China, M.D. [facility] Specialists of [place] Office: [phone] Pager:  [phone]

## 2013-04-24 NOTE — Anesthesia Preprocedure Evaluation (Signed)
Anesthesia Evaluation  Patient identified by MRN, date of birth, ID band Patient awake    Reviewed: Allergy & Precautions, H&P , NPO status , Patient's Chart, lab work & pertinent test results  Airway       Dental   Pulmonary Current Smoker,          Cardiovascular hypertension, + Peripheral Vascular Disease     Neuro/Psych Anxiety CVA    GI/Hepatic   Endo/Other    Renal/GU      Musculoskeletal   Abdominal   Peds  Hematology   Anesthesia Other Findings Vocal and visual effects fron CVA  Reproductive/Obstetrics                           Anesthesia Physical Anesthesia Plan  ASA: III  Anesthesia Plan: General   Post-op Pain Management:    Induction: Intravenous  Airway Management Planned: Oral ETT  Additional Equipment: Arterial line  Intra-op Plan:   Post-operative Plan: Extubation in OR  Informed Consent: I have reviewed the patients History and Physical, chart, labs and discussed the procedure including the risks, benefits and alternatives for the proposed anesthesia with the patient or authorized representative who has indicated his/her understanding and acceptance.     Plan Discussed with: CRNA, Anesthesiologist and Surgeon  Anesthesia Plan Comments:         Anesthesia Quick Evaluation

## 2013-04-24 NOTE — Consult Note (Signed)
ANTIBIOTIC CONSULT NOTE - INITIAL  Pharmacy Consult for : Zinacef Indication: Post op antibiotic prophylaxsis, pharmacy may adjust.  Allergies:  NKA  CrCl: Estimated Creatinine Clearance: 76.4 ml/min (by C-G formula based on Cr of 0.71).  Antibiotics:   aspirin EC 81 mg Oral Daily  atorvastatin 80 mg Oral Daily  cefUROXime (ZINACEF)  IV 1.5 g Intravenous Q12H  [START ON 04/25/2013] docusate sodium 100 mg Oral Daily  [START ON 04/25/2013] enoxaparin (LOVENOX) injection 30 mg Subcutaneous Q24H  HYDROmorphone     HYDROmorphone     lisinopril 20 mg Oral Daily  ondansetron     oxyCODONE-acetaminophen     pantoprazole 40 mg Oral Daily     Assessment:  57 y/o female s/p L-CEA placed on Zinacef 1.5 gm IV q 12 hours x 2 doses post-op.  CrCl 76 ml/min.  No renal adjustment in antibiotics needed.  Goal of Therapy:   Renal Adjustment of Antibiotics  Plan:   Continue Zinacef as ordered.  Pharmacy will sign off at this time, please call for further questions  Laurena Bering,  Pharm.D.  04/24/2013 4:57 PM

## 2013-04-24 NOTE — H&P (View-Only) (Signed)
[facility] [place]   Patient name: Leah Wolf MRN: [id number] DOB: 03/02/1956 Sex: female   Referred by: Dr. Sweezer  Reason for referral:  Chief Complaint  Patient presents with  . New Evaluation    New bilateral carotid stenosis  referred by Dr William Sweezer [facility] [place]    HISTORY OF PRESENT ILLNESS: The patient comes in today for evaluation of her carotid disease. She presented to [facility] in early June with symptoms of slurred speech severe headache in vision changes. She was diagnosed with an acute infarct in the left posterior parietal and left occipital lobe. There was diffuse narrowing throughout the left ICA was significant stenosis in the distal left ICA and absence of signal in the left MCA. This was all the MRI. She went on to have a CT scan which showed diffuse abnormality within the left carotid system. There may be an elongated plaque or probable old dissection. He was greater than 90% diameter stenosis of the left common carotid artery and origin of the left internal carotid artery with greater than 99% stenosis. Because of the dissection, the patient was started on anticoagulation. She was also started on a statin for hypercholesterolemia. Since her discharge, she has residual defects in her vision, however these are improved from her initial presentation. She also has trouble with her finding. Her speech is somewhat slurred but improved. She will occasionally get bilateral headaches. She continues to smoke but is trying to stop.  Past Medical History  Diagnosis Date  . Anxiety   . Cervical pain (neck)   . Carotid artery occlusion     left  . Hypertension   . Hyperlipidemia   . Acute CVA (cerebrovascular accident)   . Tobacco abuse     Past Surgical History  Procedure Laterality Date  . Tonsillectomy    . Tubal ligation      History   Social History  . Marital Status: Single    Spouse Name: N/A    Number of  Children: N/A  . Years of Education: N/A   Occupational History  . Not on file.   Social History Main Topics  . Smoking status: Current Every Day Smoker -- 40 years    Types: Cigarettes  . Smokeless tobacco: Never Used  . Alcohol Use: No  . Drug Use: No  . Sexually Active: Not on file   Other Topics Concern  . Not on file   Social History Narrative  . No narrative on file    Family History  Problem Relation Age of Onset  . Heart disease Mother   . Heart disease Brother     Allergies as of 04/07/2013  . (Not on File)    Current Outpatient Prescriptions on File Prior to Visit  Medication Sig Dispense Refill  . aspirin 81 MG tablet Take 81 mg by mouth daily.      . atorvastatin (LIPITOR) 80 MG tablet Take 80 mg by mouth daily.      . lisinopril (PRINIVIL,ZESTRIL) 20 MG tablet Take 20 mg by mouth daily.      . Rivaroxaban (XARELTO) 20 MG TABS Take 20 mg by mouth daily.       No current facility-administered medications on file prior to visit.     REVIEW OF SYSTEMS: Please see history of present illness, otherwise all systems are negative  PHYSICAL EXAMINATION: General: The patient appears their stated age.  Vital signs are BP 139/76  Pulse 90  Resp 18    Ht 5' 4" (1.626 m)  Wt 165 lb (74.844 kg)  BMI 28.31 kg/m2 HEENT:  No gross abnormalities Pulmonary: Respirations are non-labored Abdomen: Soft and non-tender  Musculoskeletal: There are no major deformities.   Neurologic: Trouble with word finding, slurred speech Skin: There are no ulcer or rashes noted. Psychiatric: The patient has normal affect. Cardiovascular: There is a regular rate and rhythm without significant murmur appreciated. No carotid bruits  Diagnostic Studies: Please see history of present illness for details of her imaging studies. She had a carotid ultrasound which was ordered and reviewed today. This shows a 40-59% bilateral carotid stenosis. There is a less than 50% stenosis within the left  common carotid artery.    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 Since the CT scan mentioned a possibility of dissection, and she was treated with anticoagulation I wonder if she has had some remolding of a dissection which is why her ultrasound findings are not as severe as a CT scan suggested back in June. Before making a decision as to whether or not we should proceed with intervention or what kind of intervention she needs, I feel that I must repeat some for diagnostic imaging. I have elected to repeat her CT angiogram rather than formal angiography because she is on anticoagulation. She will have this done in followup to see me in one week. At that time I will make a decision as to whether or not she needs intervention and what kind of intervention it should be.     V. Brazie Jo Booze IV, M.D. [facility] [place] Office: [phone] Pager:  [phone]    

## 2013-04-24 NOTE — Anesthesia Procedure Notes (Signed)
Procedure Name: Intubation Date/Time: 04/24/2013 10:15 AM Performed by: Charm Barges, Kacey Vicuna R Pre-anesthesia Checklist: Patient identified, Emergency Drugs available, Suction available, Patient being monitored and Timeout performed Patient Re-evaluated:Patient Re-evaluated prior to inductionOxygen Delivery Method: Circle system utilized Preoxygenation: Pre-oxygenation with 100% oxygen Intubation Type: IV induction Ventilation: Mask ventilation without difficulty Laryngoscope Size: 4 Grade View: Grade II Tube type: Oral Tube size: 7.5 mm Number of attempts: 1 Airway Equipment and Method: Stylet Placement Confirmation: ETT inserted through vocal cords under direct vision and positive ETCO2 Secured at: 21 cm Tube secured with: Tape Dental Injury: Teeth and Oropharynx as per pre-operative assessment

## 2013-04-25 ENCOUNTER — Encounter (HOSPITAL_COMMUNITY): Payer: Self-pay | Admitting: Surgery

## 2013-04-25 LAB — CBC
HCT: 33.1 % — ABNORMAL LOW (ref 36.0–46.0)
MCHC: 33.5 g/dL (ref 30.0–36.0)
MCV: 91.2 fL (ref 78.0–100.0)
Platelets: 155 10*3/uL (ref 150–400)
RDW: 14.1 % (ref 11.5–15.5)
WBC: 7.2 10*3/uL (ref 4.0–10.5)

## 2013-04-25 LAB — BASIC METABOLIC PANEL
BUN: 7 mg/dL (ref 6–23)
Calcium: 8.2 mg/dL — ABNORMAL LOW (ref 8.4–10.5)
Creatinine, Ser: 0.6 mg/dL (ref 0.50–1.10)
GFR calc Af Amer: >90 mL/min (ref >90)
GFR calc non Af Amer: >90 mL/min (ref >90)

## 2013-04-25 MED ORDER — OXYCODONE-ACETAMINOPHEN 5-325 MG PO TABS: 1.0000 | ORAL_TABLET | Freq: Four times a day (QID) | ORAL | Status: DC | PRN

## 2013-04-25 MED ORDER — ASPIRIN 81 MG PO TBEC: 81.0000 mg | DELAYED_RELEASE_TABLET | Freq: Every day | ORAL | Status: DC

## 2013-04-25 MED ORDER — ROSUVASTATIN CALCIUM 5 MG PO TABS: 5.0000 mg | ORAL_TABLET | Freq: Every day | ORAL | Status: DC

## 2013-04-25 NOTE — Discharge Summary (Signed)
[facility] Specialists Discharge Summary   Patient ID:  Leah Wolf MRN: [id number] DOB/AGE: 04-22-1956 57 y.o.  Admit date: 04/24/2013 Discharge date: 04/25/2013 Date of Surgery: 04/24/2013 Surgeon: Surgeon(s): Nada Libman, MD  Admission Diagnosis: LEFT ICA STENOSIS  Discharge Diagnoses:  LEFT ICA STENOSIS  Secondary Diagnoses: Past Medical History  Diagnosis Date  . Anxiety   . Cervical pain (neck)   . Carotid artery occlusion     left  . Hypertension   . Hyperlipidemia   . Acute CVA (cerebrovascular accident)   . Tobacco abuse     Procedure(s): ENDARTERECTOMY CAROTID with patch angioplasty  Discharged Condition: good  HPI: The patient comes in today for evaluation of her carotid disease. She presented to [facility] in early June with symptoms of slurred speech severe headache in vision changes. She was diagnosed with an acute infarct in the left posterior parietal and left occipital lobe. There was diffuse narrowing throughout the left ICA was significant stenosis in the distal left ICA and absence of signal in the left MCA. This was all the MRI. She went on to have a CT scan which showed diffuse abnormality within the left carotid system. There may be an elongated plaque or probable old dissection. He was greater than 90% diameter stenosis of the left common carotid artery and origin of the left internal carotid artery with greater than 99% stenosis. Because of the dissection, the patient was started on anticoagulation. She was also started on a statin for hypercholesterolemia. Since her discharge, she has residual defects in her vision, however these are improved from her initial presentation. She also has trouble with her finding. Her speech is somewhat slurred but improved. She will occasionally get bilateral headaches. She continues to smoke but is trying to stop.   She underwent left carotid endarterectomy 04/24/2013 with an uneventful hospital stay.   No signs of stroke or TIA post-op.   Hospital Course:  Leah Wolf is a 57 y.o. female is S/P Left Procedure(s): ENDARTERECTOMY CAROTID with patch angioplasty Extubated: POD # 0 Physical exam: Pt is A&O x 3  Speech is fluent  left Neck Wound is clean, dry, intact or ecchymotic  Negative weakness UE/LE  Negative tongue deviation  Negative facial droop  Post-op wounds clean, dry, intact or ecchymotic Pt. Ambulating, voiding and taking PO diet without difficulty. Pt pain controlled with PO pain meds. Labs as below Complications:none  Consults:     Significant Diagnostic Studies: CBC Lab Results  Component Value Date   WBC 7.2 04/25/2013   HGB 11.1* 04/25/2013   HCT 33.1* 04/25/2013   MCV 91.2 04/25/2013   PLT 155 04/25/2013    BMET    Component Value Date/Time   NA 136 04/25/2013 0420   K 3.9 04/25/2013 0420   CL 106 04/25/2013 0420   CO2 22 04/25/2013 0420   GLUCOSE 108* 04/25/2013 0420   BUN 7 04/25/2013 0420   CREATININE 0.60 04/25/2013 0420   CREATININE 0.64 04/14/2013 1420   CALCIUM 8.2* 04/25/2013 0420   GFRNONAA >90 04/25/2013 0420   GFRAA >90 04/25/2013 0420   COAG Lab Results  Component Value Date   INR 0.94 04/21/2013     Disposition:  Discharge to :Home Discharge Orders   Future Orders Complete By Expires   Call MD for:  redness, tenderness, or signs of infection (pain, swelling, bleeding, redness, odor or green/yellow discharge around incision site)  As directed    Call MD for:  severe or  increased pain, loss or decreased feeling  in affected limb(s)  As directed    Call MD for:  temperature >100.5  As directed    Driving Restrictions  As directed    Comments:     No driving for 2 weeks   Increase activity slowly  As directed    Comments:     Walk with assistance use walker or cane as needed   Lifting restrictions  As directed    Comments:     No lifting for 6 weeks   May shower   As directed    Resume previous diet  As directed         Medication List    STOP taking these medications       atorvastatin 80 MG tablet  Commonly known as:  LIPITOR      TAKE these medications       aspirin EC 81 MG tablet  Take 81 mg by mouth daily.     aspirin 81 MG EC tablet  Take 1 tablet (81 mg total) by mouth daily.     lisinopril 20 MG tablet  Commonly known as:  PRINIVIL,ZESTRIL  Take 20 mg by mouth daily.     LORazepam 2 MG tablet  Commonly known as:  ATIVAN  Take 2 mg by mouth 4 (four) times daily.     oxyCODONE-acetaminophen 5-325 MG per tablet  Commonly known as:  PERCOCET/ROXICET  Take 1-2 tablets by mouth every 6 (six) hours as needed.     rosuvastatin 5 MG tablet  Commonly known as:  CRESTOR  Take 1 tablet (5 mg total) by mouth daily.       Patient unable to tolerate Lipitor.  We will prescribe Crestor for home 5mg  QD.   Verbal and written Discharge instructions given to the patient. Wound care per Discharge AVS   Signed: Clinton Gallant [facility] 04/25/2013, 8:17 AM  --- For VQI Registry use --- Instructions: Press F2 to tab through selections.  Delete question if not applicable.   Modified Rankin score at D/C (0-6): Rankin Score=0  IV medication needed for:  1. Hypertension: No 2. Hypotension: No  Post-op Complications: No  1. Post-op CVA or TIA: No  If yes: Event classification (right eye, left eye, right cortical, left cortical, verterobasilar, other):   If yes: Timing of event (intra-op, <6 hrs post-op, >=6 hrs post-op, unknown):   2. CN injury: No  If yes: CN  injuried   3. Myocardial infarction: No  If yes: Dx by (EKG or clinical, Troponin):   4.  CHF: No  5.  Dysrhythmia (new): No  6. Wound infection: No  7. Reperfusion symptoms: No  8. Return to OR: No  If yes: return to OR for (bleeding, neurologic, other CEA incision, other):   Discharge medications: Statin use:  Yes ASA use:  Yes Beta blocker use:  No  for medical reason   ACE-Inhibitor use:  No  for medical  reason   P2Y12 Antagonist use: Arly.Keller ] None, [ ]  Plavix, [ ]  Plasugrel, [ ]  Ticlopinine, [ ]  Ticagrelor, [ ]  Other, [ ]  No for medical reason, [ ]  Non-compliant, [ ]  Not-indicated Anti-coagulant use:  Arly.Keller ] None, [ ]  Warfarin, [ ]  Rivaroxaban, [ ]  Dabigatran, [ ]  Other, [ ]  No for medical reason, [ ]  Non-compliant, [ ]  Not-indicated  Zaineb Nowaczyk MAUREEN Pa-c

## 2013-04-25 NOTE — Progress Notes (Addendum)
VASCULAR AND VEIN SURGERY POST - OP CEA NOTE  HPI:.id had a left Carotid Endarterectomy 1 day ago. Patient is doing well.  Patient denies headache; denies difficulty swallowing; denies weakness; denies symptoms of stroke or TIA  Filed Vitals:   04/25/13 0700  BP:   Pulse:   Temp: 99 F (37.2 C)  Resp:    Scheduled Meds: . aspirin EC  81 mg Oral Daily  . atorvastatin  80 mg Oral Daily  . docusate sodium  100 mg Oral Daily  . enoxaparin (LOVENOX) injection  30 mg Subcutaneous Q24H  . lisinopril  20 mg Oral Daily  . pantoprazole  40 mg Oral Daily   Continuous Infusions: . sodium chloride 125 mL/hr at 04/25/13 0452  . DOPamine      Pt is A&O x 3 Speech is fluent left Neck Wound is clean, dry, intact or ecchymotic Negative weakness UE/LE Negative tongue deviation Negative facial droop  Plan: Follow-up in 2 weeks then in 6 months for Carotid Duplex scan  COLLINS, EMMA MAUREEN PA-C I agree with the above. The patient is neurologically intact. She is stable for discharge.  Durene Cal

## 2013-04-25 NOTE — Progress Notes (Signed)
Pt d/c home this am. Reviewed d/c instructions, Rx, etc. Pt and family understood.

## 2013-04-25 NOTE — Progress Notes (Signed)
A LINE D/C PER MD ORDER

## 2013-04-25 NOTE — Discharge Summary (Signed)
I agree with the above. The patient is neurologically intact. She is stable for discharge.  Leah Wolf

## 2013-04-28 ENCOUNTER — Telehealth: Payer: Self-pay | Admitting: *Deleted

## 2013-04-28 NOTE — Telephone Encounter (Signed)
Mr Neale Burly called for Almendra but I talked with Gavin Pound regarding the Percocet increasing her agitation and nervousness. She wanted to know if she could take Advil. In talking with her,she said that actually when she took 2mg  rather than 1mg  of Ativan; she was able to relax and she didn't hurt. I spoke with Dr Myra Gianotti and he said she could not take Advil but if Ativan worked to use that. I told Rigby and said if it didn't help to call us back. She agreed.

## 2013-05-06 ENCOUNTER — Telehealth: Payer: Self-pay

## 2013-05-06 DIAGNOSIS — I10 Essential (primary) hypertension: Secondary | ICD-10-CM

## 2013-05-06 MED ORDER — LISINOPRIL 20 MG PO TABS: 20.0000 mg | ORAL_TABLET | Freq: Every day | ORAL | Status: DC

## 2013-05-06 NOTE — Telephone Encounter (Signed)
Phone call from pt. with request for refill on BP medication, Lisinopril 20 mg.  States she was discharged from the hospital on this medication, and only has 2 pills left.  Advised pt. that Dr. Myra Gianotti doesn't manage her BP medication, and she should call her PCP or cardiologist.  Stated she has trouble getting her phone call answered at her PCP's office, and is afraid she will run out of medication.  Advised pt., this nurse will send a one time refill for Lisinopril 20 mg,1 tab daily; # 30 tabs;  Advised that she definitely needs to call her PCP for future refills.  Pt. verb. understanding.

## 2013-05-12 ENCOUNTER — Ambulatory Visit: Payer: Medicare Other | Admitting: Surgery

## 2013-06-26 ENCOUNTER — Telehealth: Payer: Self-pay

## 2013-06-26 NOTE — Telephone Encounter (Signed)
Pt's friend called to schedule appt. for pt.  Reports she c/o left neck hurting when she turns her head.   Also states she continues to have speech and vision problems.  States pt. never got a follow-up appt. to see Dr. Myra Gianotti after the surgery.  Phone call to pt. to discuss symtoms.  Pt's speech is very slow (previous CVA)  Reports she has "a tightness" in her left neck when she lifts her head up.  Denies any swallowing difficulty.  Denies noticing any redness/ swelling.  Reports having intermittent episodes of visual changes; describes seeing z's in one or both eyes, that lasts a short time.  States that she has had these episodes for about 2 years.  States she was told that the vision change could be related to Migraine headaches.  Pt. denies any weakness of extremities.  Questioned if she is being followed by a medical doctor or neurologist?  States she has a PCP, and saw her approx. 1 mo. ago.   Gave pt. appt. For f/u w/ Dr. Myra Gianotti for 10/27 @ 3:00 PM.  Strongly encouraged her to schedule appt. with her PCP to discuss ongoing problems with her vision.  Verb. Understanding.

## 2013-06-27 ENCOUNTER — Encounter: Payer: Self-pay | Admitting: Surgery

## 2013-06-30 ENCOUNTER — Encounter: Payer: Self-pay | Admitting: Surgery

## 2013-06-30 ENCOUNTER — Ambulatory Visit (HOSPITAL_COMMUNITY)
Admission: RE | Admit: 2013-06-30 | Discharge: 2013-06-30 | Disposition: A | Discharge status: Home / Self Care | Payer: Medicare Other | Source: Ambulatory Visit | Attending: Surgery | Admitting: Surgery

## 2013-06-30 ENCOUNTER — Ambulatory Visit (INDEPENDENT_AMBULATORY_CARE_PROVIDER_SITE_OTHER): Payer: Self-pay | Admitting: Surgery

## 2013-06-30 ENCOUNTER — Other Ambulatory Visit: Payer: Self-pay | Admitting: Surgery

## 2013-06-30 DIAGNOSIS — H539 Unspecified visual disturbance: Secondary | ICD-10-CM

## 2013-06-30 DIAGNOSIS — I7789 Other specified disorders of arteries and arterioles: Secondary | ICD-10-CM | POA: Insufficient documentation

## 2013-06-30 DIAGNOSIS — I6529 Occlusion and stenosis of unspecified carotid artery: Secondary | ICD-10-CM

## 2013-06-30 DIAGNOSIS — Z8673 Personal history of transient ischemic attack (TIA), and cerebral infarction without residual deficits: Secondary | ICD-10-CM | POA: Insufficient documentation

## 2013-06-30 DIAGNOSIS — Z5189 Encounter for other specified aftercare: Secondary | ICD-10-CM

## 2013-06-30 DIAGNOSIS — T819XXD Unspecified complication of procedure, subsequent encounter: Secondary | ICD-10-CM

## 2013-06-30 DIAGNOSIS — R1310 Dysphagia, unspecified: Secondary | ICD-10-CM

## 2013-06-30 NOTE — Progress Notes (Signed)
The patient comes in today for followup. in August of 2014 she underwent a left carotid endarterectomy.  Earlier this summer in [place], she presented with a acute stroke.  I performed a CT angiogram preoperatively which showed a high-grade left carotid artery stenosis.  She was taken to the operating room and this was confirmed, it was approximately 90%.  I was unable to place a 8 [place] shunt because of the small caliber distal internal carotid artery.  Her postoperative course was uncomplicated and she was discharged to home.  The patient did not show for her initial postoperative visit.  She is back today for followup.  She states that she has had 4 episodes since her operation where she sees zig-zag lines in her eyes.  Each episode lasts about 30 minutes and then resolves.  She states that she has had this going on since 2011.  Did resolve for a while but has returned.  She reports no other neurologic symptoms.  On examination, her incision is healing nicely.  Her neurologic exam is back to baseline.  I elected to duplex her carotids today given her symptoms.  A high resistive waveform was identified in the left distal internal carotid artery.  After the ultrasound findings I feel that a better evaluation of her carotid artery needs to be performed since she is having these symptoms.  I contemplated angiography however elected to order a CT angiogram of the head and neck for initial evaluation of her carotid arteries both in the intracranial and extracranial system.  This will be performed within the week and she will followup after.

## 2013-07-01 NOTE — Addendum Note (Signed)
Addended by: Sharee Pimple on: 07/01/2013 08:15 AM   Modules accepted: Orders

## 2013-07-04 ENCOUNTER — Encounter: Payer: Self-pay | Admitting: Surgery

## 2013-07-07 ENCOUNTER — Ambulatory Visit (INDEPENDENT_AMBULATORY_CARE_PROVIDER_SITE_OTHER): Payer: Self-pay | Admitting: Surgery

## 2013-07-07 ENCOUNTER — Inpatient Hospital Stay: Admission: RE | Admit: 2013-07-07 | Payer: Medicare Other | Source: Ambulatory Visit

## 2013-07-07 ENCOUNTER — Ambulatory Visit
Admission: RE | Admit: 2013-07-07 | Discharge: 2013-07-07 | Disposition: A | Discharge status: Home / Self Care | Payer: Medicare Other | Source: Ambulatory Visit | Attending: Surgery | Admitting: Surgery

## 2013-07-07 ENCOUNTER — Encounter: Payer: Self-pay | Admitting: Surgery

## 2013-07-07 DIAGNOSIS — T819XXD Unspecified complication of procedure, subsequent encounter: Secondary | ICD-10-CM

## 2013-07-07 DIAGNOSIS — I6529 Occlusion and stenosis of unspecified carotid artery: Secondary | ICD-10-CM

## 2013-07-07 DIAGNOSIS — Z48812 Encounter for surgical aftercare following surgery on the circulatory system: Secondary | ICD-10-CM

## 2013-07-07 DIAGNOSIS — R1310 Dysphagia, unspecified: Secondary | ICD-10-CM

## 2013-07-07 MED ORDER — IOHEXOL 350 MG/ML SOLN
100.0000 mL | Freq: Once | INTRAVENOUS | Status: AC | PRN
  Administered 2013-07-07: 100 mL via INTRAVENOUS

## 2013-07-07 NOTE — Progress Notes (Signed)
[facility]   Patient name: Leah Wolf MRN: [id number] DOB: 1956/02/26 Sex: female     Chief Complaint  Patient presents with  . Carotid    f/u CTA head/neck prior    HISTORY OF PRESENT ILLNESS: The patient is back for followup.  She is status post left carotid endarterectomy in August of 2014.Earlier this summer in [place], she presented with a acute stroke. I performed a CT angiogram preoperatively which showed a high-grade left carotid artery stenosis. She was taken to the operating room and this was confirmed, it was approximately 90%. I was unable to place a 8 [place] shunt because of the small caliber distal internal carotid artery. Her postoperative course was uncomplicated and she was discharged to home. The patient did not show for her initial postoperative visit.  At her followup last week she describes seeing Z.-zag lines in both eyes.  Each episode lasted approximately 30 minutes and then it resolved.  She did stated this had been going on since 2011.  She has not had any episodes since last week.  She had a high resistance wave form on ultrasound and therefore I elected to get a CT scan.  She is back today to discuss the results.  I have reviewed her CT angiogram with radiology.  We compared to her preoperative study.  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  Her endarterectomy site is widely patent.  Her internal carotid artery on the left is very small.  I discussed these results with the patient.  I do not feel any intervention is required at this time.  I have elected to treat her medically.  I do not see any need to confirm supraclinoid carotid occlusion with catheter angiography.  I did start the patient on Plavix.  She will followup in 6 months.   Jorge Ny,  M.D. [facility] [place] Office: [phone] Pager:  [phone]

## 2013-07-08 NOTE — Addendum Note (Signed)
Addended by: Sharee Pimple on: 07/08/2013 08:26 AM   Modules accepted: Orders

## 2014-01-02 ENCOUNTER — Encounter: Payer: Self-pay | Admitting: Family

## 2014-01-05 ENCOUNTER — Ambulatory Visit (INDEPENDENT_AMBULATORY_CARE_PROVIDER_SITE_OTHER): Payer: Medicare Other | Admitting: Family

## 2014-01-05 ENCOUNTER — Ambulatory Visit (HOSPITAL_COMMUNITY)
Admission: RE | Admit: 2014-01-05 | Discharge: 2014-01-05 | Disposition: A | Discharge status: Home / Self Care | Payer: Medicare Other | Source: Ambulatory Visit | Attending: Family | Admitting: Family

## 2014-01-05 ENCOUNTER — Encounter: Payer: Self-pay | Admitting: Family

## 2014-01-05 VITALS — BP 124/73 | HR 88 | Resp 16 | Ht 64.0 in | Wt 166.0 lb

## 2014-01-05 DIAGNOSIS — I6529 Occlusion and stenosis of unspecified carotid artery: Secondary | ICD-10-CM

## 2014-01-05 DIAGNOSIS — Z48812 Encounter for surgical aftercare following surgery on the circulatory system: Secondary | ICD-10-CM | POA: Insufficient documentation

## 2014-01-05 NOTE — Progress Notes (Signed)
Established Carotid Patient   History of Present Illness  Leah Wolf is a 58 y.o. female patient of Dr. Myra GianottiBrabham who returns today for follow up.  On 07/07/13 visit with Dr. Myra GianottiBrabham:  She is status post left carotid endarterectomy in August of 2014.Earlier this summer in [place], she presented with a acute stroke. I performed a CT angiogram preoperatively which showed a high-grade left carotid artery stenosis. She was taken to the operating room and this was confirmed, it was approximately 90%. I was unable to place a 8 [place] shunt because of the small caliber distal internal carotid artery. Her postoperative course was uncomplicated and she was discharged to home. The patient did not show for her initial postoperative visit. At her followup last week she describes seeing Z.-zag lines in both eyes. Each episode lasted approximately 30 minutes and then it resolved. She did stated this had been going on since 2011. She has not had any episodes since last week. She had a high resistance wave form on ultrasound and therefore I elected to get a CT scan. She is back today to discuss the results.  I have reviewed her CT angiogram with radiology. We compared to her preoperative study. 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 Her endarterectomy site is widely patent. Her internal carotid artery on the left is very small.  I discussed these results with the patient. I do not feel any intervention is required at this time. I have elected to treat her medically. I do not see any need to confirm supraclinoid carotid occlusion with catheter angiography. I did start the patient on Plavix. She will followup in 6 months.   Pt states she had one stroke in June, 2014 as manifested by a "terrible headache" ather  crown, generalized vision disturbance,  denies hemiparesis, denies unilateral facial drooping, did have slurred speech and still has some residual slurred speech issues. She denies any tingling, numbness, or weakness in either arm, denies dizziness. Pt denies claudication symptoms in legs with walking. Pt states she has a bulging lumbar disc and sciatica. She states that she also has c-spine issues and sees a chiropractor for this.   Pt denies New Medical or Surgical History.  Pt Diabetic: No Pt smoker: smoker  (3 cigarettes/day, started smoking in her teens)  Pt meds include: Statin : Yes ASA: Yes Other anticoagulants/antiplatelets: she did not tolerate Plavix, caused severe gastric pain.   Past Medical History  Diagnosis Date  . Anxiety   . Cervical pain (neck)   . Carotid artery occlusion     left  . Hypertension   . Hyperlipidemia   . Tobacco abuse   . Visual aura 2011  . Acute CVA (cerebrovascular accident) June 2014    Social History History  Substance Use Topics  . Smoking status: Current Every Day Smoker -- 40 years    Types: Cigarettes  . Smokeless tobacco: Never Used  . Alcohol Use: No    Family History Family History  Problem Relation Age of Onset  . Heart disease Mother   . Heart attack Mother   . Heart disease Brother     Before age 60  . Hyperlipidemia Brother   . Hypertension Brother   . Heart attack Brother   . Heart disease Brother   . Hyperlipidemia Brother   .  Hypertension Brother   . Heart attack Brother     Surgical History Past Surgical History  Procedure Laterality Date  . Tonsillectomy    . Tubal ligation    . Endarterectomy Left 04/24/2013    Procedure: ENDARTERECTOMY CAROTID with patch angioplasty;  Surgeon: Nada Libman, MD;  Location: [facility] OR;  Service: Vascular;  Laterality: Left;  . Carotid endarterectomy Left Aug. 21, 2015    cea    Allergies  Allergen Reactions  . Percocet [Oxycodone-Acetaminophen] Anxiety    Current Outpatient Prescriptions  Medication  Sig Dispense Refill  . aspirin EC 81 MG EC tablet Take 1 tablet (81 mg total) by mouth daily.  30 tablet  0  . lisinopril (PRINIVIL,ZESTRIL) 20 MG tablet Take 1 tablet (20 mg total) by mouth daily.  30 tablet  0  . LORazepam (ATIVAN) 2 MG tablet Take 2 mg by mouth 4 (four) times daily.       . rosuvastatin (CRESTOR) 5 MG tablet Take 1 tablet (5 mg total) by mouth daily.  30 tablet  2  . aspirin EC 81 MG tablet Take 81 mg by mouth daily.      Marland Kitchen oxyCODONE-acetaminophen (PERCOCET/ROXICET) 5-325 MG per tablet Take 1-2 tablets by mouth every 6 (six) hours as needed.  30 tablet  0   No current facility-administered medications for this visit.    Review of Systems : See HPI for pertinent positives and negatives.  Physical Examination  Filed Vitals:   01/05/14 1210 01/05/14 1215  BP: 144/86 124/73  Pulse: 89 88  Resp:  16  Height:  5\' 4"  (1.626 m)  Weight:  166 lb (75.297 kg)  SpO2:  97%  Body mass index is 28.48 kg/(m^2).   General: WDWN female in NAD GAIT: normal Eyes: PERRLA Pulmonary:  Non-labored, CTAB, Negative  Rales, Negative rhonchi, & Negative wheezing.  Cardiac: regular Rhythm ,  Negative detected murmur.  VASCULAR EXAM Carotid Bruits Left Right   Negative Negative   Radial pulses are 2+ palpable and equal.                                                                                                                            LE Pulses LEFT RIGHT       POPLITEAL  not palpable   not palpable       POSTERIOR TIBIAL   palpable    palpable        DORSALIS PEDIS      ANTERIOR TIBIAL not palpable  not palpable     Gastrointestinal: soft, nontender, BS WNL, no r/g,  negative masses.  Musculoskeletal: Negative muscle atrophy/wasting. M/S 5/5 throughout, Extremities without ischemic changes.  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   Non-Invasive Vascular Imaging CAROTID DUPLEX 01/05/2014   CEREBROVASCULAR DUPLEX EVALUATION    INDICATION: Carotid  artery disease     PREVIOUS INTERVENTION(S): Left carotid endarterectomy with bovine pericardial patch angioplasty 04/24/2013.    DUPLEX EXAM:     RIGHT  LEFT  Peak Systolic Velocities (cm/s) End Diastolic Velocities (cm/s) Plaque LOCATION Peak Systolic Velocities (cm/s) End Diastolic Velocities (cm/s) Plaque  123 42  CCA PROXIMAL 97 21   119 43  CCA MID 125 32 HM  119 43  CCA DISTAL 143 34 HM  165 32  ECA 223 47   141 67 SP ICA PROXIMAL 31 6   123 59  ICA MID 116 35 HT  105 52  ICA DISTAL 66 19     1.03 ICA / CCA Ratio (PSV)   Antegrade  Vertebral Flow Antegrade    Brachial Systolic Pressure (mmHg)    Brachial Artery Waveforms     Plaque Morphology:  HM = Homogeneous, HT = Heterogeneous, CP = Calcific Plaque, SP = Smooth Plaque, IP = Irregular Plaque     ADDITIONAL FINDINGS:     IMPRESSION: Right internal carotid artery velocities are suggestive of a 40-59% stenosis.  Patent left carotid endarterectomy site with evidence of hyperplasia present in the common carotid and proximal internal carotid artery. Elevated velocities noted at the distal patch site with a pre to mid stenosis ratio of 3.7.     Compared to the previous exam:  Stable right internal carotid artery. Left mid internal carotid artery velocities have significantly elevated compared to the last exam on 06/30/2013.      Assessment: Leah Wolf is a 58 y.o. female who is status post left carotid endarterectomy in August of 2014. She has had no stroke or TIA activity since June, 2014. Right internal carotid artery velocities are suggestive of a 40-59% stenosis.  Patent left carotid endarterectomy site with evidence of hyperplasia present in the common carotid and proximal internal carotid artery. Elevated velocities noted at the distal patch site with a pre to mid stenosis ratio of 3.7.   Stable right internal carotid artery. Left mid internal carotid artery velocities have significantly elevated compared to the last exam on 06/30/2013.  Consider intensive statin therapy which is associated with a greater reduction in CVD risk and improved endothelial function , will defer to PCP. She was unable to tolerate Plavix due to gastric irritation.    Plan: Based on today's exam and Duplex result, and after discussing with Dr. Myra GianottiBrabham, patient advised to follow up in 6 months with Carotid Duplex scan. ICA stenting will be considered if the left ICA stenosis becomes more significant. She was counseled re smoking cessation. She was advised to walk at least 30 minutes daily, at least 5 days/week.  I discussed in depth with the patient the nature of atherosclerosis, and emphasized the importance of maximal medical management including strict control of blood pressure, blood glucose, and lipid levels, obtaining regular exercise, and cessation of smoking.  The patient is aware that without maximal medical management the underlying atherosclerotic disease process will progress, limiting the benefit of any interventions. The patient was given information about stroke prevention and what symptoms should prompt the patient to seek immediate medical care. Thank you for allowing us to participate in this patient's care.  Charisse MarchSuzanne Zoya Sprecher, RN, MSN, FNP-C [facility] Office: [phone]  Clinic Physician: Myra GianottiBrabham  01/05/2014 12:10 PM

## 2014-01-05 NOTE — Patient Instructions (Addendum)
Stroke Prevention Some medical conditions and behaviors are associated with an increased chance of having a stroke. You may prevent a stroke by making healthy choices and managing medical conditions. HOW CAN I REDUCE MY RISK OF HAVING A STROKE?   Stay physically active. Get at least 30 minutes of activity on most or all days.  Do not smoke. It may also be helpful to avoid exposure to secondhand smoke.  Limit alcohol use. Moderate alcohol use is considered to be:  No more than 2 drinks per day for men.  No more than 1 drink per day for nonpregnant women.  Eat healthy foods. This involves  Eating 5 or more servings of fruits and vegetables a day.  Following a diet that addresses high blood pressure (hypertension), high cholesterol, diabetes, or obesity.  Manage your cholesterol levels.  A diet low in saturated fat, trans fat, and cholesterol and high in fiber may control cholesterol levels.  Take any prescribed medicines to control cholesterol as directed by your health care provider.  Manage your diabetes.  A controlled-carbohydrate, controlled-sugar diet is recommended to manage diabetes.  Take any prescribed medicines to control diabetes as directed by your health care provider.  Control your hypertension.  A low-salt (sodium), low-saturated fat, low-trans fat, and low-cholesterol diet is recommended to manage hypertension.  Take any prescribed medicines to control hypertension as directed by your health care provider.  Maintain a healthy weight.  A reduced-calorie, low-sodium, low-saturated fat, low-trans fat, low-cholesterol diet is recommended to manage weight.  Stop drug abuse.  Avoid taking birth control pills.  Talk to your health care provider about the risks of taking birth control pills if you are over 35 years old, smoke, get migraines, or have ever had a blood clot.  Get evaluated for sleep disorders (sleep apnea).  Talk to your health care provider about  getting a sleep evaluation if you snore a lot or have excessive sleepiness.  Take medicines as directed by your health care provider.  For some people, aspirin or blood thinners (anticoagulants) are helpful in reducing the risk of forming abnormal blood clots that can lead to stroke. If you have the irregular heart rhythm of atrial fibrillation, you should be on a blood thinner unless there is a good reason you cannot take them.  Understand all your medicine instructions.  Make sure that other other conditions (such as anemia or atherosclerosis) are addressed. SEEK IMMEDIATE MEDICAL CARE IF:   You have sudden weakness or numbness of the face, arm, or leg, especially on one side of the body.  Your face or eyelid droops to one side.  You have sudden confusion.  You have trouble speaking (aphasia) or understanding.  You have sudden trouble seeing in one or both eyes.  You have sudden trouble walking.  You have dizziness.  You have a loss of balance or coordination.  You have a sudden, severe headache with no known cause.  You have new chest pain or an irregular heartbeat. Any of these symptoms may represent a serious problem that is an emergency. Do not wait to see if the symptoms will go away. Get medical help at once. Call your local emergency services  (911 in [place] Do not drive yourself to the hospital. Document Released: 09/28/2004 Document Revised: 06/11/2013 Document Reviewed: 02/21/2013 ExitCare Patient Information 2014 [employer].   Smoking Cessation Quitting smoking is important to your health and has many advantages. However, it is not always easy to quit since nicotine is a   very addictive drug. Often times, people try 3 times or more before being able to quit. This document explains the best ways for you to prepare to quit smoking. Quitting takes hard work and a lot of effort, but you can do it. ADVANTAGES OF QUITTING SMOKING  You will live longer, feel better,  and live better.  Your body will feel the impact of quitting smoking almost immediately.  Within 20 minutes, blood pressure decreases. Your pulse returns to its normal level.  After 8 hours, carbon monoxide levels in the blood return to normal. Your oxygen level increases.  After 24 hours, the chance of having a heart attack starts to decrease. Your breath, hair, and body stop smelling like smoke.  After 48 hours, damaged nerve endings begin to recover. Your sense of taste and smell improve.  After 72 hours, the body is virtually free of nicotine. Your bronchial tubes relax and breathing becomes easier.  After 2 to 12 weeks, lungs can hold more air. Exercise becomes easier and circulation improves.  The risk of having a heart attack, stroke, cancer, or lung disease is greatly reduced.  After 1 year, the risk of coronary heart disease is cut in half.  After 5 years, the risk of stroke falls to the same as a nonsmoker.  After 10 years, the risk of lung cancer is cut in half and the risk of other cancers decreases significantly.  After 15 years, the risk of coronary heart disease drops, usually to the level of a nonsmoker.  If you are pregnant, quitting smoking will improve your chances of having a healthy baby.  The people you live with, especially any children, will be healthier.  You will have extra money to spend on things other than cigarettes. QUESTIONS TO THINK ABOUT BEFORE ATTEMPTING TO QUIT You may want to talk about your answers with your caregiver.  Why do you want to quit?  If you tried to quit in the past, what helped and what did not?  What will be the most difficult situations for you after you quit? How will you plan to handle them?  Who can help you through the tough times? Your family? Friends? A caregiver?  What pleasures do you get from smoking? What ways can you still get pleasure if you quit? Here are some questions to ask your caregiver:  How can you  help me to be successful at quitting?  What medicine do you think would be best for me and how should I take it?  What should I do if I need more help?  What is smoking withdrawal like? How can I get information on withdrawal? GET READY  Set a quit date.  Change your environment by getting rid of all cigarettes, ashtrays, matches, and lighters in your home, car, or work. Do not let people smoke in your home.  Review your past attempts to quit. Think about what worked and what did not. GET SUPPORT AND ENCOURAGEMENT You have a better chance of being successful if you have help. You can get support in many ways.  Tell your family, friends, and co-workers that you are going to quit and need their support. Ask them not to smoke around you.  Get individual, group, or telephone counseling and support. Programs are available at local hospitals and health centers. Call your local health department for information about programs in your area.  Spiritual beliefs and practices may help some smokers quit.  Download a "quit meter" on your computer   to keep track of quit statistics, such as how long you have gone without smoking, cigarettes not smoked, and money saved.  Get a self-help book about quitting smoking and staying off of tobacco. LEARN NEW SKILLS AND BEHAVIORS  Distract yourself from urges to smoke. Talk to someone, go for a walk, or occupy your time with a task.  Change your normal routine. Take a different route to work. Drink tea instead of coffee. Eat breakfast in a different place.  Reduce your stress. Take a hot bath, exercise, or read a book.  Plan something enjoyable to do every day. Reward yourself for not smoking.  Explore interactive web-based programs that specialize in helping you quit. GET MEDICINE AND USE IT CORRECTLY Medicines can help you stop smoking and decrease the urge to smoke. Combining medicine with the above behavioral methods and support can greatly increase  your chances of successfully quitting smoking.  Nicotine replacement therapy helps deliver nicotine to your body without the negative effects and risks of smoking. Nicotine replacement therapy includes nicotine gum, lozenges, inhalers, nasal sprays, and skin patches. Some may be available over-the-counter and others require a prescription.  Antidepressant medicine helps people abstain from smoking, but how this works is unknown. This medicine is available by prescription.  Nicotinic receptor partial agonist medicine simulates the effect of nicotine in your brain. This medicine is available by prescription. Ask your caregiver for advice about which medicines to use and how to use them based on your health history. Your caregiver will tell you what side effects to look out for if you choose to be on a medicine or therapy. Carefully read the information on the package. Do not use any other product containing nicotine while using a nicotine replacement product.  RELAPSE OR DIFFICULT SITUATIONS Most relapses occur within the first 3 months after quitting. Do not be discouraged if you start smoking again. Remember, most people try several times before finally quitting. You may have symptoms of withdrawal because your body is used to nicotine. You may crave cigarettes, be irritable, feel very hungry, cough often, get headaches, or have difficulty concentrating. The withdrawal symptoms are only temporary. They are strongest when you first quit, but they will go away within 10 14 days. To reduce the chances of relapse, try to:  Avoid drinking alcohol. Drinking lowers your chances of successfully quitting.  Reduce the amount of caffeine you consume. Once you quit smoking, the amount of caffeine in your body increases and can give you symptoms, such as a rapid heartbeat, sweating, and anxiety.  Avoid smokers because they can make you want to smoke.  Do not let weight gain distract you. Many smokers will gain  weight when they quit, usually less than 10 pounds. Eat a healthy diet and stay active. You can always lose the weight gained after you quit.  Find ways to improve your mood other than smoking. FOR MORE INFORMATION  www.smokefree.gov  Document Released: 08/15/2001 Document Revised: 02/20/2012 Document Reviewed: 11/30/2011 ExitCare Patient Information 2014 [employer].  

## 2014-01-05 NOTE — Addendum Note (Signed)
Addended by: Sharee PimpleMCCHESNEY, MARILYN K on: 01/05/2014 02:31 PM   Modules accepted: Orders

## 2014-04-24 HISTORY — PX: CAROTID ENDARTERECTOMY: SUR193

## 2014-07-13 ENCOUNTER — Other Ambulatory Visit (HOSPITAL_COMMUNITY): Payer: Medicare Other

## 2014-07-13 ENCOUNTER — Ambulatory Visit: Payer: Medicare Other | Admitting: Surgery

## 2014-07-29 ENCOUNTER — Encounter: Payer: Self-pay | Admitting: Surgery

## 2014-07-29 IMAGING — CT CT ANGIO NECK
1 of 8 series · 2 of 33 positions shown · IV contrast ([ID] OMNI 350)
Comparison: Outside studies from [REDACTED] are not
available.

CLINICAL DATA: Confusion and dizziness.  Recent left brain stroke.

CT ANGIOGRAPHY HEAD
TECHNIQUE: Multidetector CT imaging of the head was performed
using the standard protocol during bolus administration of
intravenous contrast.  Multiplanar CT image reconstructions
including MIPs were obtained to evaluate the vascular anatomy.
Contrast: 100mL OMNIPAQUE IOHEXOL 350 MG/ML SOLN

[Series 5: cta neck · axial · 0.31mm/px · z∈[-277,-34]mm · 2 of 98 slices shown]
[im 1/98  soft-tissue]
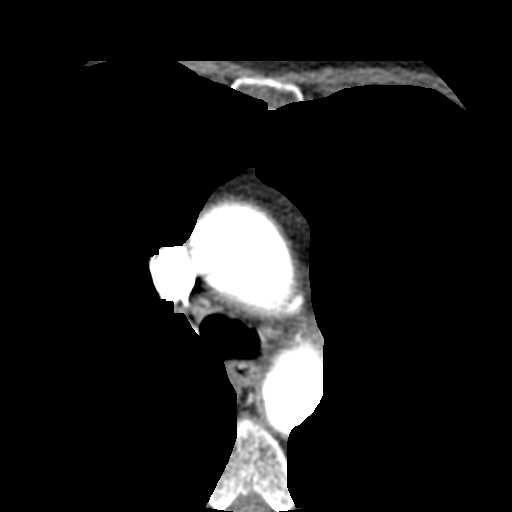
[im 98/98  bone]
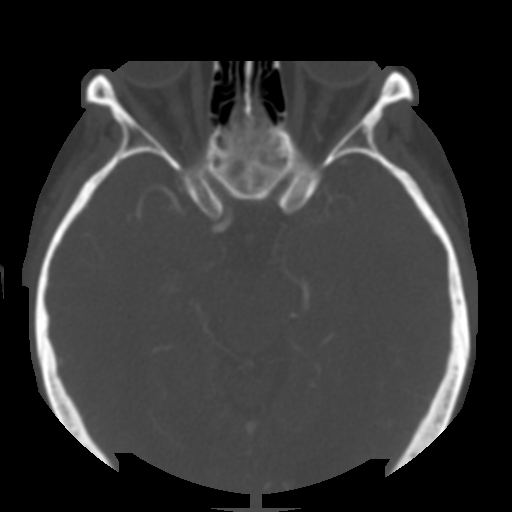

[2 of 33 positions shown; findings below may reference images not displayed]

FINDINGS: There is a bovine origin to the left carotid artery from
the innominate trunk.  There may be slight LCCA ostial disease but
no focal plaque is identified.   There is no proximal right common
carotid, right subclavian, or left subclavian disease.  Both
vertebral origins are widely patent with the left dominant.

There is mild nonstenotic atheromatous change of the right carotid
bifurcation.  There is a very shallow ulceration associated with
posterior wall plaque.  There is no ECA stenosis.

The left carotid system is abnormal.  There is a 2 cm long lesion
in the distal common carotid artery opposite the left lobe of the
thyroid which reduces the luminal cross-sectional diameter to 1.9 x
2.5 mm as seen on image 41 series 5.  More superiorly, there is a
severe ostial stenosis at the origin of the left internal carotid
artery measuring 0.6 mm diameter. A tandem lesion 1 cm superiorly
narrows the lumen to 2 mm diameter.  The cervical internal carotid
artery is diffusely small, I believe due to severe stenosis rather
than dissection.  No evidence for pseudoaneurysm.  The luminal
diameter is approximately 50% on the left versus the right at the
C2 level.

No neck masses are present.  Lung apices are clear.  No osseous
lesions.  Mild cervical spondylosis.  Airway midline.

 Review of the MIP images confirms the above findings.
IMPRESSION: The dominant abnormality is at the left internal carotid artery
origin where severe luminal narrowing is observed   (0.6 mm.) I
estimate this represents a 90% or greater stenosis, if the distal
left internal carotid artery were normal caliber.  Tandem lesions
are seen above and below this involving the distal left common and
proximal left internal as described above.

I believe the cervical internal carotid artery is small due to
decreased flow rather than a chronic dissection. This small distal
internal carotid artery would artificially underestimate the
proximal left internal carotid artery stenosis when strictly
calculated according to NASCET criteria.

Bovine origin with mild ostial irregularity at the left internal
carotid artery origin.

No significant right carotid, subclavian, or vertebral disease.

## 2014-08-03 ENCOUNTER — Ambulatory Visit: Payer: Medicare Other | Admitting: Family

## 2014-08-03 ENCOUNTER — Ambulatory Visit (HOSPITAL_COMMUNITY)
Admission: RE | Admit: 2014-08-03 | Discharge: 2014-08-03 | Disposition: A | Discharge status: Home / Self Care | Payer: Medicare Other | Source: Ambulatory Visit | Attending: Family | Admitting: Family

## 2014-08-03 DIAGNOSIS — Z48812 Encounter for surgical aftercare following surgery on the circulatory system: Secondary | ICD-10-CM

## 2014-08-03 DIAGNOSIS — I6529 Occlusion and stenosis of unspecified carotid artery: Secondary | ICD-10-CM | POA: Insufficient documentation

## 2014-08-03 DIAGNOSIS — Z01810 Encounter for preprocedural cardiovascular examination: Secondary | ICD-10-CM | POA: Diagnosis not present

## 2014-08-03 NOTE — Progress Notes (Signed)
Verified with patient no one sided weakness or numbness, no vision changes, patient still presents with speech disturbance this is unchanged since previous visits she says. Patient was told she would be given full results from vascular and vein specialist staff in regards to her ultrasound today via phone.

## 2014-08-04 ENCOUNTER — Telehealth: Payer: Self-pay | Admitting: Surgery

## 2014-08-04 NOTE — Telephone Encounter (Signed)
-----   Message from Carma LairSuzanne L Nickel, NP sent at 08/03/2014  2:15 PM EST ----- Regarding: carotid Duplex finding from today Melba, Please call pt and let her know that there is a finding on her carotid Duplex today that Dr. Myra GianottiBrabham needs to discuss with her, he will also need to examine her. Please assist her to make an appointment with Dr. Myra GianottiBrabham. Thank you, Rosalita ChessmanSuzanne

## 2014-08-04 NOTE — Telephone Encounter (Signed)
Melba scheduled with patient, dpm

## 2014-08-10 ENCOUNTER — Ambulatory Visit (INDEPENDENT_AMBULATORY_CARE_PROVIDER_SITE_OTHER): Payer: Medicare Other | Admitting: Surgery

## 2014-08-10 ENCOUNTER — Encounter: Payer: Self-pay | Admitting: Surgery

## 2014-08-10 VITALS — BP 147/75 | HR 93 | Ht 64.0 in | Wt 172.4 lb

## 2014-08-10 DIAGNOSIS — I6523 Occlusion and stenosis of bilateral carotid arteries: Secondary | ICD-10-CM

## 2014-08-10 DIAGNOSIS — I6529 Occlusion and stenosis of unspecified carotid artery: Secondary | ICD-10-CM

## 2014-08-10 NOTE — Progress Notes (Signed)
Patient name: Leah BridgeDeborah C Wickham MRN: [id number] DOB: 09/22/1955 Sex: female     Chief Complaint  Patient presents with  . Re-evaluation    carotid stenosis    HISTORY OF PRESENT ILLNESS: The patient is back for followup. She is status post left carotid endarterectomy in August of 2014.Earlier this summer in [place], she presented with a acute stroke. I performed a CT angiogram preoperatively which showed a high-grade left carotid artery stenosis. She was taken to the operating room and this was confirmed, it was approximately 90%. I was unable to place a 8 [place] shunt because of the small caliber distal internal carotid artery. Her postoperative course was uncomplicated and she was discharged to home. The patient did not show for her initial postoperative visit. At her followup last week she describes seeing Z.-zag lines in both eyes. Each episode lasted approximately 30 minutes and then it resolved. She did stated this had been going on since 2011. She has not had any episodes since last week. She had a high resistance wave form on ultrasound and therefore I elected to get a CT scan. She is back today to discuss the results.  I have reviewed her CT angiogram with radiology. We compared to her preoperative study. 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 Her endarterectomy site is widely patent. Her internal carotid artery on the left is very small.  I discussed these results with the patient. I do not feel any intervention is required at this time. I have elected to treat her medically. I do not see any need to confirm supraclinoid carotid occlusion with catheter angiography. I did start the patient on Plavix.  The patient reports no new symptoms since I last saw her  Past Medical History  Diagnosis Date  .  Anxiety   . Cervical pain (neck)   . Carotid artery occlusion     left  . Hypertension   . Hyperlipidemia   . Tobacco abuse   . Visual aura 2011  . Acute CVA (cerebrovascular accident) June 2014    Past Surgical History  Procedure Laterality Date  . Tonsillectomy    . Tubal ligation    . Endarterectomy Left 04/24/2013    Procedure: ENDARTERECTOMY CAROTID with patch angioplasty;  Surgeon: Nada LibmanVance W Britini Garcilazo, MD;  [facility];  Service: Vascular;  Laterality: Left;  . Carotid endarterectomy Left Aug. 21, 2015    cea    History   Social History  . Marital Status: Single    Spouse Name: N/A    Number of Children: N/A  . Years of Education: N/A   Occupational History  . Not on file.   Social History Main Topics  . Smoking status: Current Every Day Smoker -- 40 years    Types: Cigarettes  . Smokeless tobacco: Never Used  . Alcohol Use: No  . Drug Use: No  . Sexual Activity: Not on file   Other Topics Concern  . Not on file   Social History Narrative    Family History  Problem Relation Age of Onset  . Heart disease Mother   . Heart attack Mother   . Heart disease Brother     Before age 60  . Hyperlipidemia Brother   . Hypertension Brother   . Heart attack Brother   .  Heart disease Brother   . Hyperlipidemia Brother   . Hypertension Brother   . Heart attack Brother     Allergies as of 08/10/2014 - Review Complete 01/05/2014  Allergen Reaction Noted  . Percocet [oxycodone-acetaminophen] Anxiety 01/05/2014    Current Outpatient Prescriptions on File Prior to Visit  Medication Sig Dispense Refill  . aspirin EC 81 MG EC tablet Take 1 tablet (81 mg total) by mouth daily. 30 tablet 0  . aspirin EC 81 MG tablet Take 81 mg by mouth daily.    Marland Kitchen. lisinopril (PRINIVIL,ZESTRIL) 20 MG tablet Take 1 tablet (20 mg total) by mouth daily. 30 tablet 0  . LORazepam (ATIVAN) 2 MG tablet Take 2 mg by mouth 4 (four) times daily.     . rosuvastatin (CRESTOR) 5 MG tablet Take 1  tablet (5 mg total) by mouth daily. 30 tablet 2  . oxyCODONE-acetaminophen (PERCOCET/ROXICET) 5-325 MG per tablet Take 1-2 tablets by mouth every 6 (six) hours as needed. (Patient not taking: Reported on 08/10/2014) 30 tablet 0   No current facility-administered medications on file prior to visit.     REVIEW OF SYSTEMS: Cardiovascular: No chest pain, chest pressure, palpitations, orthopnea, or dyspnea on exertion. No claudication or rest pain,  No history of DVT or phlebitis. Pulmonary: No productive cough, asthma or wheezing. Neurologic: No weakness, paresthesias, aphasia, or amaurosis. No dizziness. Hematologic: No bleeding problems or clotting disorders. Musculoskeletal: No joint pain or joint swelling. Gastrointestinal: No blood in stool or hematemesis Genitourinary: No dysuria or hematuria. Psychiatric:: No history of major depression. Integumentary: No rashes or ulcers. Constitutional: No fever or chills.  PHYSICAL EXAMINATION:   Vital signs are BP 147/75 mmHg  Pulse 93  Ht 5\' 4"  (1.626 m)  Wt 172 lb 6.4 oz (78.2 kg)  BMI 29.58 kg/m2  SpO2 100% General: The patient appears their stated age. HEENT:  No gross abnormalities Pulmonary:  Non labored breathing Musculoskeletal: There are no major deformities. Neurologic: No focal weakness or paresthesias are detected, Skin: There are no ulcer or rashes noted. Psychiatric: The patient has normal affect. Cardiovascular: There is a regular rate and rhythm without significant murmur appreciated.   Diagnostic Studies Carotid duplex was reviewed and discussed with the patient.  Her left carotid artery has become occluded.  She has 40-59 percent stenosis on the right.  Assessment: Interval occlusion of left carotid Plan: The patient was brought back to discuss the ultrasound findings as she left without being seen last time.  She has not had any interval symptoms.  I discussed with her that there is no role for surgical intervention  with her occluded carotid artery.  I discussed that he most likely source of her occlusion is secondary to intracranial occlusion which was identified on CT angiogram 6 months ago.  In addition she had very small internal carotid artery which could've provided outflow issues.  Regardless she is asymptomatic after her carotid occluded and therefore she should just continue medical management.  I discussed the importance of evaluating the contralateral side to make sure that it does not progress.  She will follow up in 6 months with a repeat carotid ultrasound  V. Charlena CrossWells Kelicia Youtz IV, M.D. [facility] [place] Office: [phone] Pager:  [phone]

## 2014-08-10 NOTE — Addendum Note (Signed)
Addended by: Sharee PimpleMCCHESNEY, Alie Hardgrove K on: 08/10/2014 01:29 PM   Modules accepted: Orders

## 2015-01-14 ENCOUNTER — Encounter: Payer: Self-pay | Admitting: Cardiology

## 2015-02-05 ENCOUNTER — Encounter: Payer: Self-pay | Admitting: Surgery

## 2015-02-08 ENCOUNTER — Ambulatory Visit: Payer: Medicare Other | Admitting: Surgery

## 2015-02-08 ENCOUNTER — Encounter (HOSPITAL_COMMUNITY): Payer: Medicare Other

## 2015-02-16 ENCOUNTER — Encounter: Payer: Self-pay | Admitting: Surgery

## 2015-02-19 ENCOUNTER — Encounter (HOSPITAL_COMMUNITY): Payer: Medicare Other

## 2015-02-19 ENCOUNTER — Ambulatory Visit: Payer: Medicare Other | Admitting: Surgery

## 2015-03-25 ENCOUNTER — Encounter: Payer: Self-pay | Admitting: Surgery

## 2015-03-29 ENCOUNTER — Encounter: Payer: Self-pay | Admitting: Surgery

## 2015-03-29 ENCOUNTER — Ambulatory Visit (INDEPENDENT_AMBULATORY_CARE_PROVIDER_SITE_OTHER): Payer: Medicare Other | Admitting: Surgery

## 2015-03-29 ENCOUNTER — Ambulatory Visit (HOSPITAL_COMMUNITY)
Admission: RE | Admit: 2015-03-29 | Discharge: 2015-03-29 | Disposition: A | Discharge status: Home / Self Care | Payer: Medicare Other | Source: Ambulatory Visit | Attending: Surgery | Admitting: Surgery

## 2015-03-29 VITALS — BP 142/78 | HR 76 | Ht 64.0 in | Wt 151.1 lb

## 2015-03-29 DIAGNOSIS — I6523 Occlusion and stenosis of bilateral carotid arteries: Secondary | ICD-10-CM | POA: Insufficient documentation

## 2015-03-29 DIAGNOSIS — Z48812 Encounter for surgical aftercare following surgery on the circulatory system: Secondary | ICD-10-CM | POA: Diagnosis not present

## 2015-03-29 NOTE — Addendum Note (Signed)
Addended by: Adria Dill L on: 03/29/2015 01:02 PM   Modules accepted: Orders

## 2015-03-29 NOTE — Progress Notes (Signed)
Patient name: Leah Wolf MRN: [id number] DOB: [birth year] Sex: female     Chief Complaint  Patient presents with  . Re-evaluation    7 month f/u - carotid     HISTORY OF PRESENT ILLNESS: The patient is back for followup. She is status post left carotid endarterectomy in August of 2014.Earlier this summer in [place], she presented with a acute stroke. I performed a CT angiogram preoperatively which showed a high-grade left carotid artery stenosis. She was taken to the operating room and this was confirmed, it was approximately 90%. I was unable to place a 8 [place] shunt because of the small caliber distal internal carotid artery. Her postoperative course was uncomplicated and she was discharged to home. Her postoperative ultrasound she had a high resistance waveform.  Therefore I ordered a CT angiogram which revealed intracranial carotid occlusion in retrospect this may have been present prior to her operation.  The patient has continued to be treated medically.  She denies any new neurologic symptoms.  Past Medical History  Diagnosis Date  . Anxiety   . Cervical pain (neck)   . Carotid artery occlusion     left  . Hypertension   . Hyperlipidemia   . Tobacco abuse   . Visual aura 2011  . Acute CVA (cerebrovascular accident) June 2014    Past Surgical History  Procedure Laterality Date  . Tonsillectomy    . Tubal ligation    . Endarterectomy Left 04/24/2013    Procedure: ENDARTERECTOMY CAROTID with patch angioplasty;  Surgeon: Nada Libman, MD;  [facility]: [facility] OR;  Service: Vascular;  Laterality: Left;  . Carotid endarterectomy Left Aug. 21, 2015    cea    History   Social History  . Marital Status: Single    Spouse Name: N/A  . Number of Children: N/A  . Years of Education: N/A   Occupational History  . Not on file.   Social History Main Topics  . Smoking status: Current Every Day Smoker -- 40 years    Types: Cigarettes  . Smokeless tobacco: Never Used  .  Alcohol Use: No  . Drug Use: No  . Sexual Activity: Not on file   Other Topics Concern  . Not on file   Social History Narrative    Family History  Problem Relation Age of Onset  . Heart disease Mother   . Heart attack Mother   . Heart disease Brother     Before age 49  . Hyperlipidemia Brother   . Hypertension Brother   . Heart attack Brother   . Heart disease Brother   . Hyperlipidemia Brother   . Hypertension Brother   . Heart attack Brother     Allergies as of 03/29/2015 - Review Complete 03/29/2015  Allergen Reaction Noted  . Percocet [oxycodone-acetaminophen] Anxiety 01/05/2014    Current Outpatient Prescriptions on File Prior to Visit  Medication Sig Dispense Refill  . aspirin EC 81 MG EC tablet Take 1 tablet (81 mg total) by mouth daily. 30 tablet 0  . aspirin EC 81 MG tablet Take 81 mg by mouth daily.    Marland Kitchen lisinopril (PRINIVIL,ZESTRIL) 20 MG tablet Take 1 tablet (20 mg total) by mouth daily. 30 tablet 0  . LORazepam (ATIVAN) 2 MG tablet Take 2 mg by mouth 4 (four) times daily.     . rosuvastatin (CRESTOR) 5 MG tablet Take 1 tablet (5 mg total) by mouth daily. 30 tablet 2  . oxyCODONE-acetaminophen (PERCOCET/ROXICET) 5-325  MG per tablet Take 1-2 tablets by mouth every 6 (six) hours as needed. (Patient not taking: Reported on 08/10/2014) 30 tablet 0   No current facility-administered medications on file prior to visit.     REVIEW OF SYSTEMS: Cardiovascular: No chest pain, chest pressure, palpitations, orthopnea, or dyspnea on exertion. No claudication or rest pain,  No history of DVT or phlebitis. Pulmonary: No productive cough, asthma or wheezing. Neurologic: No weakness, paresthesias, aphasia, or amaurosis. No dizziness. Hematologic: No bleeding problems or clotting disorders. Musculoskeletal: No joint pain or joint swelling. Gastrointestinal: No blood in stool or hematemesis Genitourinary: No dysuria or hematuria. Psychiatric:: No history of major  depression. Integumentary: No rashes or ulcers. Constitutional: No fever or chills.  PHYSICAL EXAMINATION:   Vital signs are  Filed Vitals:   03/29/15 1117 03/29/15 1118  BP: 136/84 142/78  Pulse: 76   Height:  (1.626 m)   Weight: 151 lb 1.6 oz (68.539 kg)   SpO2: 100%    Body mass index is 25.92 kg/(m^2). General: The patient appears their stated age. HEENT:  No gross abnormalities Pulmonary:  Non labored breathing Musculoskeletal: There are no major deformities. Neurologic: No focal weakness or paresthesias are detected, Skin: There are no ulcer or rashes noted. Psychiatric: The patient has normal affect. Cardiovascular: There is a regular rate and rhythm without significant murmur appreciated.  right carotid bruit  Diagnostic Studies  I have reviewed her carotid ultrasound from today there is 40-59 percent right carotid stenosis  Assessment:  carotid occlusive disease Plan:  patient is doing very well at this time.  I will have her follow-up in 6 months with a repeat carotid Doppler study.  Jorge Ny, M.D. [facility] [place] Office: [phone] Pager:  [phone]

## 2015-09-24 ENCOUNTER — Encounter: Payer: Self-pay | Admitting: Family

## 2015-10-04 ENCOUNTER — Ambulatory Visit: Payer: Medicare Other | Admitting: Family

## 2015-10-04 ENCOUNTER — Encounter (HOSPITAL_COMMUNITY): Payer: Medicare Other

## 2015-11-30 ENCOUNTER — Encounter: Payer: Self-pay | Admitting: Vascular Surgery

## 2015-12-08 ENCOUNTER — Ambulatory Visit (HOSPITAL_COMMUNITY): Payer: Medicare Other

## 2015-12-08 ENCOUNTER — Ambulatory Visit: Payer: Medicare Other | Admitting: Family

## 2016-02-08 ENCOUNTER — Other Ambulatory Visit (HOSPITAL_COMMUNITY): Payer: Self-pay | Admitting: Interventional Radiology

## 2016-02-08 DIAGNOSIS — I771 Stricture of artery: Secondary | ICD-10-CM

## 2016-02-08 DIAGNOSIS — R130 Aphagia: Secondary | ICD-10-CM

## 2016-02-10 ENCOUNTER — Ambulatory Visit (HOSPITAL_COMMUNITY)
Admission: RE | Admit: 2016-02-10 | Discharge: 2016-02-10 | Disposition: A | Discharge status: Home / Self Care | Payer: Medicare Other | Source: Ambulatory Visit | Attending: Interventional Radiology | Admitting: Interventional Radiology

## 2016-02-10 ENCOUNTER — Other Ambulatory Visit (HOSPITAL_COMMUNITY): Payer: Self-pay | Admitting: Interventional Radiology

## 2016-02-10 ENCOUNTER — Other Ambulatory Visit: Payer: Self-pay | Admitting: Radiology

## 2016-02-10 DIAGNOSIS — I771 Stricture of artery: Secondary | ICD-10-CM

## 2016-02-10 DIAGNOSIS — I6350 Cerebral infarction due to unspecified occlusion or stenosis of unspecified cerebral artery: Secondary | ICD-10-CM

## 2016-02-10 DIAGNOSIS — R130 Aphagia: Secondary | ICD-10-CM

## 2016-02-11 ENCOUNTER — Encounter (HOSPITAL_COMMUNITY): Payer: Self-pay

## 2016-02-11 ENCOUNTER — Ambulatory Visit (HOSPITAL_COMMUNITY)
Admission: RE | Admit: 2016-02-11 | Discharge: 2016-02-11 | Disposition: A | Discharge status: Home / Self Care | Payer: Medicare Other | Source: Ambulatory Visit | Attending: Interventional Radiology | Admitting: Interventional Radiology

## 2016-02-11 ENCOUNTER — Other Ambulatory Visit (HOSPITAL_COMMUNITY): Payer: Self-pay | Admitting: Interventional Radiology

## 2016-02-11 DIAGNOSIS — I10 Essential (primary) hypertension: Secondary | ICD-10-CM | POA: Diagnosis not present

## 2016-02-11 DIAGNOSIS — F17210 Nicotine dependence, cigarettes, uncomplicated: Secondary | ICD-10-CM | POA: Diagnosis not present

## 2016-02-11 DIAGNOSIS — R29810 Facial weakness: Secondary | ICD-10-CM | POA: Diagnosis present

## 2016-02-11 DIAGNOSIS — I771 Stricture of artery: Secondary | ICD-10-CM

## 2016-02-11 DIAGNOSIS — Z7982 Long term (current) use of aspirin: Secondary | ICD-10-CM | POA: Diagnosis not present

## 2016-02-11 DIAGNOSIS — G8191 Hemiplegia, unspecified affecting right dominant side: Secondary | ICD-10-CM | POA: Insufficient documentation

## 2016-02-11 DIAGNOSIS — I63233 Cerebral infarction due to unspecified occlusion or stenosis of bilateral carotid arteries: Secondary | ICD-10-CM | POA: Diagnosis not present

## 2016-02-11 DIAGNOSIS — Z7902 Long term (current) use of antithrombotics/antiplatelets: Secondary | ICD-10-CM | POA: Diagnosis not present

## 2016-02-11 DIAGNOSIS — Z8249 Family history of ischemic heart disease and other diseases of the circulatory system: Secondary | ICD-10-CM | POA: Diagnosis not present

## 2016-02-11 DIAGNOSIS — I6350 Cerebral infarction due to unspecified occlusion or stenosis of unspecified cerebral artery: Secondary | ICD-10-CM

## 2016-02-11 DIAGNOSIS — E785 Hyperlipidemia, unspecified: Secondary | ICD-10-CM | POA: Diagnosis not present

## 2016-02-11 DIAGNOSIS — F419 Anxiety disorder, unspecified: Secondary | ICD-10-CM | POA: Insufficient documentation

## 2016-02-11 LAB — PROTIME-INR
INR: 0.98 (ref 0.00–1.49)
Prothrombin Time: 13.2 seconds (ref 11.6–15.2)

## 2016-02-11 LAB — CBC
HCT: 41.8 % (ref 36.0–46.0)
HEMOGLOBIN: 13.6 g/dL (ref 12.0–15.0)
MCH: 30 pg (ref 26.0–34.0)
MCHC: 32.5 g/dL (ref 30.0–36.0)
MCV: 92.1 fL (ref 78.0–100.0)
PLATELETS: 192 10*3/uL (ref 150–400)
RBC: 4.54 MIL/uL (ref 3.87–5.11)
RDW: 12.2 % (ref 11.5–15.5)
WBC: 6.3 10*3/uL (ref 4.0–10.5)

## 2016-02-11 LAB — BASIC METABOLIC PANEL
ANION GAP: 9 (ref 5–15)
BUN: 7 mg/dL (ref 6–20)
CHLORIDE: 105 mmol/L (ref 101–111)
CO2: 24 mmol/L (ref 22–32)
CREATININE: 0.8 mg/dL (ref 0.44–1.00)
Calcium: 9.2 mg/dL (ref 8.9–10.3)
GFR calc non Af Amer: >60 mL/min (ref >60)
Glucose, Bld: 123 mg/dL — ABNORMAL HIGH (ref 65–99)
POTASSIUM: 3.6 mmol/L (ref 3.5–5.1)
SODIUM: 138 mmol/L (ref 135–145)

## 2016-02-11 LAB — APTT: aPTT: 28 seconds (ref 24–37)

## 2016-02-11 MED ORDER — MIDAZOLAM HCL 2 MG/2ML IJ SOLN
INTRAMUSCULAR | Status: AC | PRN
  Administered 2016-02-11: 0.5 mg via INTRAVENOUS

## 2016-02-11 MED ORDER — LIDOCAINE HCL 1 % IJ SOLN
INTRAMUSCULAR | Status: AC
  Administered 2016-02-11: 10 mL
  Filled 2016-02-11: qty 20

## 2016-02-11 MED ORDER — SODIUM CHLORIDE 0.9 % IV SOLN
Freq: Once | INTRAVENOUS | Status: AC
  Administered 2016-02-11: 11:00:00 via INTRAVENOUS

## 2016-02-11 MED ORDER — SODIUM CHLORIDE 0.9 % IV SOLN
INTRAVENOUS | Status: AC | PRN
  Administered 2016-02-11: 75 mL/h via INTRAVENOUS

## 2016-02-11 MED ORDER — LORAZEPAM 0.5 MG PO TABS
ORAL_TABLET | ORAL | Status: DC
Start: 2016-02-11 — End: 2016-02-12
  Filled 2016-02-11: qty 4

## 2016-02-11 MED ORDER — MIDAZOLAM HCL 2 MG/2ML IJ SOLN
INTRAMUSCULAR | Status: DC
Start: 2016-02-11 — End: 2016-02-12
  Filled 2016-02-11: qty 2

## 2016-02-11 MED ORDER — CLOPIDOGREL BISULFATE 75 MG PO TABS
ORAL_TABLET | ORAL | Status: DC
Start: 2016-02-11 — End: 2016-02-12
  Filled 2016-02-11: qty 1

## 2016-02-11 MED ORDER — LORAZEPAM 2 MG PO TABS
2.0000 mg | ORAL_TABLET | Freq: Once | ORAL | Status: AC
  Administered 2016-02-11: 2 mg via ORAL
  Filled 2016-02-11: qty 1

## 2016-02-11 MED ORDER — FENTANYL CITRATE (PF) 100 MCG/2ML IJ SOLN
INTRAMUSCULAR | Status: AC
  Filled 2016-02-11: qty 2

## 2016-02-11 MED ORDER — SODIUM CHLORIDE 0.9 % IV SOLN: INTRAVENOUS | Status: AC

## 2016-02-11 MED ORDER — IOPAMIDOL (ISOVUE-300) INJECTION 61%
INTRAVENOUS | Status: AC
  Administered 2016-02-11: 5 mL
  Filled 2016-02-11: qty 50

## 2016-02-11 MED ORDER — IOPAMIDOL (ISOVUE-300) INJECTION 61%
INTRAVENOUS | Status: AC
  Administered 2016-02-11: 80 mL
  Filled 2016-02-11: qty 150

## 2016-02-11 MED ORDER — FENTANYL CITRATE (PF) 100 MCG/2ML IJ SOLN
INTRAMUSCULAR | Status: AC | PRN
  Administered 2016-02-11: 12.5 ug via INTRAVENOUS

## 2016-02-11 MED ORDER — CLOPIDOGREL BISULFATE 75 MG PO TABS
75.0000 mg | ORAL_TABLET | Freq: Once | ORAL | Status: AC
  Administered 2016-02-11: 75 mg via ORAL

## 2016-02-11 MED ORDER — ASPIRIN 81 MG PO CHEW
CHEWABLE_TABLET | ORAL | Status: AC
  Filled 2016-02-11: qty 1

## 2016-02-11 MED ORDER — ASPIRIN 81 MG PO CHEW
81.0000 mg | CHEWABLE_TABLET | Freq: Once | ORAL | Status: AC
  Administered 2016-02-11: 81 mg via ORAL

## 2016-02-11 MED ORDER — HEPARIN SOD (PORK) LOCK FLUSH 100 UNIT/ML IV SOLN
INTRAVENOUS | Status: AC
  Filled 2016-02-11: qty 5

## 2016-02-11 MED ORDER — HEPARIN SODIUM (PORCINE) 1000 UNIT/ML IJ SOLN
INTRAMUSCULAR | Status: AC | PRN
  Administered 2016-02-11: 1000 [IU] via INTRAVENOUS

## 2016-02-11 NOTE — Sedation Documentation (Signed)
Patient is resting comfortably. 

## 2016-02-11 NOTE — Sedation Documentation (Signed)
Exoseal placed; pressure being held to R groin

## 2016-02-11 NOTE — Sedation Documentation (Signed)
IR tech to place exoseal to R groin 

## 2016-02-11 NOTE — H&P (Signed)
Chief Complaint: recent CVA  Referring Physician: Mellissa Kohut  Supervising Physician: Luanne Bras  Patient Status: Out-pt  HPI: Leah Wolf is an 60 y.o. female who has a history of a CVA in 2014.  She just had a CVA on Sunday of this week.  She was taken to [facility] for 2 days.  She is on plavix.  She was discharged and asked to follow up with Dr. Estanislado Pandy for evaluation.  She saw him yesterday and and scheduled to have a cerebral angiogram done to further evaluate her anatomy.  She is here today for this procedure.   Past Medical History:  Past Medical History  Diagnosis Date  . Anxiety   . Cervical pain (neck)   . Carotid artery occlusion     left  . Hypertension   . Hyperlipidemia   . Tobacco abuse   . Visual aura 2011  . Acute CVA (cerebrovascular accident) [facility]) June 2014    Past Surgical History:  Past Surgical History  Procedure Laterality Date  . Tonsillectomy    . Tubal ligation    . Endarterectomy Left 04/24/2013    Procedure: ENDARTERECTOMY CAROTID with patch angioplasty;  Surgeon: Serafina Mitchell, MD;  Location: [facility] OR;  Service: Vascular;  Laterality: Left;  . Carotid endarterectomy Left Aug. 21, 2015    cea    Family History:  Family History  Problem Relation Age of Onset  . Heart disease Mother   . Heart attack Mother   . Heart disease Brother     Before age 29  . Hyperlipidemia Brother   . Hypertension Brother   . Heart attack Brother   . Heart disease Brother   . Hyperlipidemia Brother   . Hypertension Brother   . Heart attack Brother     Social History:  reports that she has been smoking Cigarettes.  She has smoked for the past 40 years. She has never used smokeless tobacco. She reports that she does not drink alcohol or use illicit drugs.  Allergies:  Allergies  Allergen Reactions  . Percocet [Oxycodone-Acetaminophen] Anxiety  . Morphine And Related     Not sure     Medications:   Medication List      ASK your doctor about these medications        aspirin 81 MG EC tablet  Take 1 tablet (81 mg total) by mouth daily.     atorvastatin 80 MG tablet  Commonly known as:  LIPITOR  Take 80 mg by mouth daily.     clopidogrel 75 MG tablet  Commonly known as:  PLAVIX  Take 75 mg by mouth daily.     lisinopril 20 MG tablet  Commonly known as:  PRINIVIL,ZESTRIL  Take 1 tablet (20 mg total) by mouth daily.     LORazepam 2 MG tablet  Commonly known as:  ATIVAN  Take 2 mg by mouth 4 (four) times daily.     OLANZapine 5 MG tablet  Commonly known as:  ZYPREXA  Take 2.5 mg by mouth at bedtime as needed.     omeprazole 20 MG capsule  Commonly known as:  PRILOSEC  Take 20 mg by mouth every morning.     ondansetron 4 MG tablet  Commonly known as:  ZOFRAN  Take 4 mg by mouth every 8 (eight) hours as needed for nausea or vomiting.        Please HPI for pertinent positives, otherwise complete 10 system ROS negative, except she  feels tired and has some word finding issues.  Mallampati Score: MD Evaluation Airway: WNL Heart: WNL Abdomen: WNL Chest/ Lungs: WNL ASA  Classification: 3 Mallampati/Airway Score: Two  Physical Exam: BP 119/61 mmHg  Pulse 79  Temp(Src) 98 F (36.7 C) (Oral)  Resp 18  Ht '5\' 4"'$  (1.626 m)  Wt 155 lb (70.308 kg)  BMI 26.59 kg/m2  SpO2 100% Body mass index is 26.59 kg/(m^2). General: pleasant, WD, WN white female who is laying in bed in NAD HEENT: head is normocephalic, atraumatic.  Sclera are noninjected.  PERRL.  Ears and nose without any masses or lesions.  Mouth is pink and moist Heart: regular, rate, and rhythm.  Normal s1,s2. No obvious murmurs, gallops, or rubs noted.  Palpable radial and pedal pulses bilaterally Lungs: CTAB, no wheezes, rhonchi, or rales noted.  Respiratory effort nonlabored Abd: soft, NT, ND, +BS, no masses, hernias, or organomegaly MS: all 4 extremities are symmetrical with no cyanosis, clubbing, or edema. Slightly less grip  strength in her RUE than LUE, otherwise motor/strength exam is relatively normal Psych: A&Ox3 with a flat affect.  She does word search at times and delayed speech response.   Labs: Results for orders placed or performed during the hospital encounter of 02/11/16 (from the past 48 hour(s))  APTT     Status: None   Collection Time: 02/11/16 10:45 AM  Result Value Ref Range   aPTT 28 24 - 37 seconds  Basic metabolic panel     Status: Abnormal   Collection Time: 02/11/16 10:45 AM  Result Value Ref Range   Sodium 138 135 - 145 mmol/L   Potassium 3.6 3.5 - 5.1 mmol/L   Chloride 105 101 - 111 mmol/L   CO2 24 22 - 32 mmol/L   Glucose, Bld 123 (H) 65 - 99 mg/dL   BUN 7 6 - 20 mg/dL   Creatinine, Ser 0.80 0.44 - 1.00 mg/dL   Calcium 9.2 8.9 - 10.3 mg/dL   GFR calc non Af Amer >60 >60 mL/min   GFR calc Af Amer >60 >60 mL/min    Comment: (NOTE) The eGFR has been calculated using the CKD EPI equation. This calculation has not been validated in all clinical situations. eGFR's persistently <60 mL/min signify possible Chronic Kidney Disease.    Anion gap 9 5 - 15  CBC     Status: None   Collection Time: 02/11/16 10:45 AM  Result Value Ref Range   WBC 6.3 4.0 - 10.5 K/uL   RBC 4.54 3.87 - 5.11 MIL/uL   Hemoglobin 13.6 12.0 - 15.0 g/dL   HCT 41.8 36.0 - 46.0 %   MCV 92.1 78.0 - 100.0 fL   MCH 30.0 26.0 - 34.0 pg   MCHC 32.5 30.0 - 36.0 g/dL   RDW 12.2 11.5 - 15.5 %   Platelets 192 150 - 400 K/uL  Protime-INR     Status: None   Collection Time: 02/11/16 10:45 AM  Result Value Ref Range   Prothrombin Time 13.2 11.6 - 15.2 seconds   INR 0.98 0.00 - 1.49    Imaging: No results found.  Assessment/Plan 1. Recent CVA -we will plan to perform a cerebral angiogram today to evaluate her anatomy to determine if she has a source for her recent CVA.  If so, they Dr. Estanislado Pandy could discuss possible intervention in the future. -her labs and vitals have been reviewed -Risks and Benefits  discussed with the patient including, but not limited to bleeding, infection, vascular  injury, CVA or contrast induced renal failure. All of the patient's questions were answered, patient is agreeable to proceed. Consent signed and in chart.   Thank you for this interesting consult.  I greatly enjoyed meeting Leah Wolf and look forward to participating in their care.  A copy of this report was sent to the requesting provider on this date.  Electronically Signed: Henreitta Cea 02/11/2016, 12:51 PM   I spent a total of    25 Minutes in face to face in clinical consultation, greater than 50% of which was counseling/coordinating care for recent CVA

## 2016-02-11 NOTE — Sedation Documentation (Signed)
500cc NS bolus infusing per MD

## 2016-02-11 NOTE — Discharge Instructions (Signed)

## 2016-02-11 NOTE — Procedures (Signed)
S/P 4 vessel cerebral arteriogram. RT CFa approach. Findings. 1.Occluded Lt ICA prox. 2.Approx 80 to 90 % stenosis of RT ICA caval cavernous seg

## 2016-02-15 ENCOUNTER — Other Ambulatory Visit (HOSPITAL_COMMUNITY): Payer: Self-pay | Admitting: Interventional Radiology

## 2016-02-15 DIAGNOSIS — I6350 Cerebral infarction due to unspecified occlusion or stenosis of unspecified cerebral artery: Secondary | ICD-10-CM

## 2016-02-15 DIAGNOSIS — I771 Stricture of artery: Secondary | ICD-10-CM

## 2016-02-15 NOTE — Progress Notes (Signed)
No answer, no voicemail.

## 2016-02-16 ENCOUNTER — Encounter (HOSPITAL_COMMUNITY)
Admission: RE | Disposition: A | Discharge status: Home / Self Care | Payer: Self-pay | Source: Ambulatory Visit | Attending: Interventional Radiology

## 2016-02-16 ENCOUNTER — Ambulatory Visit (HOSPITAL_COMMUNITY): Payer: Medicare Other | Admitting: Certified Registered Nurse Anesthetist

## 2016-02-16 ENCOUNTER — Ambulatory Visit (HOSPITAL_COMMUNITY)
Admission: RE | Admit: 2016-02-16 | Discharge: 2016-02-16 | Disposition: A | Discharge status: Home / Self Care | Payer: Medicare Other | Source: Ambulatory Visit | Attending: Interventional Radiology | Admitting: Interventional Radiology

## 2016-02-16 ENCOUNTER — Inpatient Hospital Stay (HOSPITAL_COMMUNITY)
Admission: RE | Admit: 2016-02-16 | Discharge: 2016-02-17 | DRG: 039 | Disposition: A | Discharge status: Home / Self Care | Payer: Medicare Other | Source: Ambulatory Visit | Attending: Interventional Radiology | Admitting: Interventional Radiology

## 2016-02-16 ENCOUNTER — Encounter (HOSPITAL_COMMUNITY): Payer: Self-pay

## 2016-02-16 ENCOUNTER — Encounter (HOSPITAL_COMMUNITY): Payer: Self-pay | Admitting: Certified Registered Nurse Anesthetist

## 2016-02-16 DIAGNOSIS — Z7902 Long term (current) use of antithrombotics/antiplatelets: Secondary | ICD-10-CM

## 2016-02-16 DIAGNOSIS — Z885 Allergy status to narcotic agent status: Secondary | ICD-10-CM

## 2016-02-16 DIAGNOSIS — I672 Cerebral atherosclerosis: Secondary | ICD-10-CM | POA: Diagnosis present

## 2016-02-16 DIAGNOSIS — I6521 Occlusion and stenosis of right carotid artery: Secondary | ICD-10-CM | POA: Diagnosis present

## 2016-02-16 DIAGNOSIS — E785 Hyperlipidemia, unspecified: Secondary | ICD-10-CM | POA: Diagnosis present

## 2016-02-16 DIAGNOSIS — F419 Anxiety disorder, unspecified: Secondary | ICD-10-CM | POA: Diagnosis present

## 2016-02-16 DIAGNOSIS — I69328 Other speech and language deficits following cerebral infarction: Secondary | ICD-10-CM

## 2016-02-16 DIAGNOSIS — I6523 Occlusion and stenosis of bilateral carotid arteries: Secondary | ICD-10-CM | POA: Diagnosis present

## 2016-02-16 DIAGNOSIS — I10 Essential (primary) hypertension: Secondary | ICD-10-CM | POA: Diagnosis present

## 2016-02-16 DIAGNOSIS — Z8249 Family history of ischemic heart disease and other diseases of the circulatory system: Secondary | ICD-10-CM

## 2016-02-16 DIAGNOSIS — I6529 Occlusion and stenosis of unspecified carotid artery: Secondary | ICD-10-CM | POA: Diagnosis present

## 2016-02-16 DIAGNOSIS — F17210 Nicotine dependence, cigarettes, uncomplicated: Secondary | ICD-10-CM | POA: Diagnosis present

## 2016-02-16 DIAGNOSIS — I69392 Facial weakness following cerebral infarction: Secondary | ICD-10-CM

## 2016-02-16 DIAGNOSIS — Z7982 Long term (current) use of aspirin: Secondary | ICD-10-CM

## 2016-02-16 DIAGNOSIS — I6621 Occlusion and stenosis of right posterior cerebral artery: Secondary | ICD-10-CM | POA: Diagnosis not present

## 2016-02-16 DIAGNOSIS — I6350 Cerebral infarction due to unspecified occlusion or stenosis of unspecified cerebral artery: Secondary | ICD-10-CM

## 2016-02-16 DIAGNOSIS — K219 Gastro-esophageal reflux disease without esophagitis: Secondary | ICD-10-CM | POA: Diagnosis present

## 2016-02-16 DIAGNOSIS — I771 Stricture of artery: Secondary | ICD-10-CM

## 2016-02-16 HISTORY — PX: RADIOLOGY WITH ANESTHESIA: SHX6223

## 2016-02-16 LAB — COMPREHENSIVE METABOLIC PANEL
ALBUMIN: 3.5 g/dL (ref 3.5–5.0)
ALK PHOS: 43 U/L (ref 38–126)
ALT: 13 U/L — AB (ref 14–54)
AST: 15 U/L (ref 15–41)
Anion gap: 8 (ref 5–15)
BUN: 8 mg/dL (ref 6–20)
CALCIUM: 9.2 mg/dL (ref 8.9–10.3)
CO2: 27 mmol/L (ref 22–32)
CREATININE: 0.79 mg/dL (ref 0.44–1.00)
Chloride: 101 mmol/L (ref 101–111)
GFR calc Af Amer: >60 mL/min (ref >60)
GFR calc non Af Amer: >60 mL/min (ref >60)
GLUCOSE: 122 mg/dL — AB (ref 65–99)
Potassium: 4.2 mmol/L (ref 3.5–5.1)
SODIUM: 136 mmol/L (ref 135–145)
Total Bilirubin: 0.2 mg/dL — ABNORMAL LOW (ref 0.3–1.2)
Total Protein: 6.5 g/dL (ref 6.5–8.1)

## 2016-02-16 LAB — CBC WITH DIFFERENTIAL/PLATELET
Basophils Absolute: 0 10*3/uL (ref 0.0–0.1)
Basophils Relative: 0 %
EOS PCT: 2 %
Eosinophils Absolute: 0.1 10*3/uL (ref 0.0–0.7)
HCT: 40.5 % (ref 36.0–46.0)
HEMOGLOBIN: 13.2 g/dL (ref 12.0–15.0)
LYMPHS ABS: 1.5 10*3/uL (ref 0.7–4.0)
Lymphocytes Relative: 24 %
MCH: 30.1 pg (ref 26.0–34.0)
MCHC: 32.6 g/dL (ref 30.0–36.0)
MCV: 92.3 fL (ref 78.0–100.0)
Monocytes Absolute: 0.4 10*3/uL (ref 0.1–1.0)
Monocytes Relative: 7 %
NEUTROS PCT: 67 %
Neutro Abs: 4.1 10*3/uL (ref 1.7–7.7)
Platelets: 242 10*3/uL (ref 150–400)
RBC: 4.39 MIL/uL (ref 3.87–5.11)
RDW: 12.1 % (ref 11.5–15.5)
WBC: 6.1 10*3/uL (ref 4.0–10.5)

## 2016-02-16 LAB — POCT ACTIVATED CLOTTING TIME
Activated Clotting Time: 197 seconds
Activated Clotting Time: 208 seconds

## 2016-02-16 LAB — PROTIME-INR
INR: 1.03 (ref 0.00–1.49)
Prothrombin Time: 13.7 seconds (ref 11.6–15.2)

## 2016-02-16 LAB — PLATELET INHIBITION P2Y12: Platelet Function  P2Y12: 208 [PRU] (ref 194–418)

## 2016-02-16 LAB — HEPARIN LEVEL (UNFRACTIONATED): Heparin Unfractionated: 0.62 IU/mL (ref 0.30–0.70)

## 2016-02-16 LAB — APTT: aPTT: 31 seconds (ref 24–37)

## 2016-02-16 SURGERY — RADIOLOGY WITH ANESTHESIA
Anesthesia: General

## 2016-02-16 MED ORDER — IOPAMIDOL (ISOVUE-300) INJECTION 61%
INTRAVENOUS | Status: AC
  Filled 2016-02-16: qty 150

## 2016-02-16 MED ORDER — HEPARIN SODIUM (PORCINE) 1000 UNIT/ML IJ SOLN
INTRAMUSCULAR | Status: DC | PRN
  Administered 2016-02-16: 3000 [IU] via INTRAVENOUS

## 2016-02-16 MED ORDER — IOPAMIDOL (ISOVUE-300) INJECTION 61%
INTRAVENOUS | Status: AC
  Administered 2016-02-16: 90 mL
  Filled 2016-02-16: qty 150

## 2016-02-16 MED ORDER — DEXAMETHASONE SODIUM PHOSPHATE 10 MG/ML IJ SOLN
INTRAMUSCULAR | Status: DC | PRN
  Administered 2016-02-16: 10 mg via INTRAVENOUS

## 2016-02-16 MED ORDER — ASPIRIN 325 MG PO TABS
325.0000 mg | ORAL_TABLET | Freq: Every day | ORAL | Status: DC
  Administered 2016-02-17: 325 mg via ORAL
  Filled 2016-02-16: qty 1

## 2016-02-16 MED ORDER — ONDANSETRON HCL 4 MG/2ML IJ SOLN: 4.0000 mg | Freq: Once | INTRAMUSCULAR | Status: DC | PRN

## 2016-02-16 MED ORDER — PHENYLEPHRINE HCL 10 MG/ML IJ SOLN
INTRAMUSCULAR | Status: DC | PRN
  Administered 2016-02-16 (×2): 80 ug via INTRAVENOUS
  Administered 2016-02-16: 40 ug via INTRAVENOUS

## 2016-02-16 MED ORDER — CLOPIDOGREL BISULFATE 75 MG PO TABS: 75.0000 mg | ORAL_TABLET | ORAL | Status: DC

## 2016-02-16 MED ORDER — LIDOCAINE HCL 1 % IJ SOLN
INTRAMUSCULAR | Status: AC
  Filled 2016-02-16: qty 20

## 2016-02-16 MED ORDER — SODIUM CHLORIDE 0.9 % IV SOLN: INTRAVENOUS | Status: DC

## 2016-02-16 MED ORDER — NICARDIPINE HCL IN NACL 20-0.86 MG/200ML-% IV SOLN
5.0000 mg/h | INTRAVENOUS | Status: DC
  Filled 2016-02-16: qty 200

## 2016-02-16 MED ORDER — PROPOFOL 10 MG/ML IV BOLUS
INTRAVENOUS | Status: DC | PRN
  Administered 2016-02-16: 140 mg via INTRAVENOUS

## 2016-02-16 MED ORDER — NITROGLYCERIN 1 MG/10 ML FOR IR/CATH LAB
INTRA_ARTERIAL | Status: AC
  Filled 2016-02-16: qty 10

## 2016-02-16 MED ORDER — ACETAMINOPHEN 650 MG RE SUPP: 650.0000 mg | Freq: Four times a day (QID) | RECTAL | Status: DC | PRN

## 2016-02-16 MED ORDER — SUCCINYLCHOLINE CHLORIDE 20 MG/ML IJ SOLN
INTRAMUSCULAR | Status: DC | PRN
  Administered 2016-02-16: 100 mg via INTRAVENOUS

## 2016-02-16 MED ORDER — ONDANSETRON HCL 4 MG/2ML IJ SOLN: 4.0000 mg | Freq: Four times a day (QID) | INTRAMUSCULAR | Status: DC | PRN

## 2016-02-16 MED ORDER — FENTANYL CITRATE (PF) 100 MCG/2ML IJ SOLN
INTRAMUSCULAR | Status: DC | PRN
  Administered 2016-02-16: 100 ug via INTRAVENOUS

## 2016-02-16 MED ORDER — LACTATED RINGERS IV SOLN
INTRAVENOUS | Status: DC | PRN
  Administered 2016-02-16 (×2): via INTRAVENOUS

## 2016-02-16 MED ORDER — NIMODIPINE 30 MG PO CAPS: 0.0000 mg | ORAL_CAPSULE | ORAL | Status: DC

## 2016-02-16 MED ORDER — GLYCOPYRROLATE 0.2 MG/ML IJ SOLN
INTRAMUSCULAR | Status: DC | PRN
  Administered 2016-02-16 (×3): 0.2 mg via INTRAVENOUS

## 2016-02-16 MED ORDER — PROTAMINE SULFATE 10 MG/ML IV SOLN
INTRAVENOUS | Status: DC | PRN
  Administered 2016-02-16: 2.5 mg via INTRAVENOUS

## 2016-02-16 MED ORDER — CLOPIDOGREL BISULFATE 75 MG PO TABS: 75.0000 mg | ORAL_TABLET | Freq: Once | ORAL | Status: DC

## 2016-02-16 MED ORDER — ROCURONIUM BROMIDE 100 MG/10ML IV SOLN
INTRAVENOUS | Status: DC | PRN
  Administered 2016-02-16: 40 mg via INTRAVENOUS
  Administered 2016-02-16: 10 mg via INTRAVENOUS

## 2016-02-16 MED ORDER — PHENYLEPHRINE HCL 10 MG/ML IJ SOLN
10.0000 mg | INTRAVENOUS | Status: DC | PRN
  Administered 2016-02-16: 25 ug/min via INTRAVENOUS

## 2016-02-16 MED ORDER — ACETAMINOPHEN 500 MG PO TABS: 1000.0000 mg | ORAL_TABLET | Freq: Four times a day (QID) | ORAL | Status: DC | PRN

## 2016-02-16 MED ORDER — HEPARIN (PORCINE) IN NACL 100-0.45 UNIT/ML-% IJ SOLN
300.0000 [IU]/h | INTRAMUSCULAR | Status: AC
  Administered 2016-02-16: 500 [IU]/h via INTRAVENOUS
  Filled 2016-02-16: qty 250

## 2016-02-16 MED ORDER — HEPARIN (PORCINE) IN NACL 100-0.45 UNIT/ML-% IJ SOLN
INTRAMUSCULAR | Status: AC
  Administered 2016-02-16: 500 [IU]/h via INTRAVENOUS
  Filled 2016-02-16: qty 250

## 2016-02-16 MED ORDER — SUGAMMADEX SODIUM 200 MG/2ML IV SOLN
INTRAVENOUS | Status: DC | PRN
  Administered 2016-02-16: 150 mg via INTRAVENOUS

## 2016-02-16 MED ORDER — LORAZEPAM 1 MG PO TABS
2.0000 mg | ORAL_TABLET | Freq: Three times a day (TID) | ORAL | Status: DC
  Administered 2016-02-16 – 2016-02-17 (×3): 2 mg via ORAL
  Filled 2016-02-16 (×3): qty 2

## 2016-02-16 MED ORDER — CLOPIDOGREL BISULFATE 75 MG PO TABS
75.0000 mg | ORAL_TABLET | Freq: Every day | ORAL | Status: DC
  Administered 2016-02-17: 75 mg via ORAL
  Filled 2016-02-16: qty 1

## 2016-02-16 MED ORDER — NIMODIPINE 30 MG PO CAPS
ORAL_CAPSULE | ORAL | Status: AC
  Filled 2016-02-16: qty 2

## 2016-02-16 MED ORDER — ASPIRIN 81 MG PO CHEW
CHEWABLE_TABLET | ORAL | Status: AC
  Administered 2016-02-16: 244 mg
  Filled 2016-02-16: qty 3

## 2016-02-16 MED ORDER — CLOPIDOGREL BISULFATE 75 MG PO TABS
ORAL_TABLET | ORAL | Status: AC
  Administered 2016-02-16: 75 mg
  Filled 2016-02-16: qty 1

## 2016-02-16 MED ORDER — LIDOCAINE HCL (CARDIAC) 20 MG/ML IV SOLN
INTRAVENOUS | Status: DC | PRN
  Administered 2016-02-16: 50 mg via INTRAVENOUS

## 2016-02-16 MED ORDER — CEFAZOLIN SODIUM-DEXTROSE 2-4 GM/100ML-% IV SOLN: 2.0000 g | INTRAVENOUS | Status: DC

## 2016-02-16 MED ORDER — ASPIRIN EC 325 MG PO TBEC: 325.0000 mg | DELAYED_RELEASE_TABLET | ORAL | Status: DC

## 2016-02-16 MED ORDER — SODIUM CHLORIDE 0.9 % IV SOLN
INTRAVENOUS | Status: DC
  Administered 2016-02-16 – 2016-02-17 (×2): via INTRAVENOUS

## 2016-02-16 MED ORDER — LABETALOL HCL 5 MG/ML IV SOLN
INTRAVENOUS | Status: DC | PRN
  Administered 2016-02-16 (×3): 5 mg via INTRAVENOUS

## 2016-02-16 MED ORDER — ONDANSETRON HCL 4 MG/2ML IJ SOLN
INTRAMUSCULAR | Status: DC | PRN
  Administered 2016-02-16: 4 mg via INTRAVENOUS

## 2016-02-16 MED ORDER — CEFAZOLIN SODIUM-DEXTROSE 2-4 GM/100ML-% IV SOLN
INTRAVENOUS | Status: AC
  Administered 2016-02-16: 2 g via INTRAVENOUS
  Filled 2016-02-16: qty 100

## 2016-02-16 MED ORDER — FENTANYL CITRATE (PF) 100 MCG/2ML IJ SOLN: 25.0000 ug | INTRAMUSCULAR | Status: DC | PRN

## 2016-02-16 NOTE — Anesthesia Procedure Notes (Signed)
Procedure Name: Intubation Date/Time: 02/16/2016 10:08 AM Performed by: Little IshikawaMERCER, Ahlayah Tarkowski L Pre-anesthesia Checklist: Patient identified, Emergency Drugs available, Suction available, Patient being monitored and Timeout performed Patient Re-evaluated:Patient Re-evaluated prior to inductionOxygen Delivery Method: Circle system utilized Preoxygenation: Pre-oxygenation with 100% oxygen Intubation Type: IV induction Ventilation: Mask ventilation without difficulty Laryngoscope Size: Mac and 3 Grade View: Grade I Tube type: Oral Tube size: 7.0 mm Number of attempts: 1 Airway Equipment and Method: Stylet Placement Confirmation: ETT inserted through vocal cords under direct vision,  positive ETCO2 and breath sounds checked- equal and bilateral Secured at: 21 cm Tube secured with: Tape Dental Injury: Teeth and Oropharynx as per pre-operative assessment

## 2016-02-16 NOTE — Procedures (Signed)
S/P RT common carotid artery angiogram,followed by intracranial angioplasty for symptomatic ICA stenosis with a resiual stenosis of less than  10 %

## 2016-02-16 NOTE — Consult Note (Signed)
PHARMACY CONSULT NOTE - Initial Consult  Pharmacy Consult for : Heparin Indication: Post-carotid artery angiogram for ICA stenosis  Allergies Allergies  Allergen Reactions  . Percocet [Oxycodone-Acetaminophen] Anxiety  . Morphine And Related Other (See Comments)    Reaction unknown    Vitals: Height: 5\' 4"  (162.6 cm) Weight: 155 lb (70.308 kg) IBW/kg (Calculated) : 54.7  BP 116/48 mmHg  Pulse 84  Temp(Src) 96.8 F (36 C)  Resp 29  Ht 5\' 4"  (1.626 m)  Wt 155 lb (70.308 kg)  BMI 26.59 kg/m2  SpO2 97%  Labs:  Recent Labs  02/16/16 0738  HGB 13.2  HCT 40.5  PLT 242  INR 1.03  CREATININE 0.79   Estimated Creatinine Clearance: 71 mL/min (by C-G formula based on Cr of 0.79).  Medical History: Past Medical History  Diagnosis Date  . Anxiety   . Cervical pain (neck)   . Carotid artery occlusion     left  . Hypertension   . Hyperlipidemia   . Tobacco abuse   . Visual aura 2011  . Acute CVA (cerebrovascular accident) [facility] June 2014    Current Medication[s] Include: Infusion[s]: Infusions:  . heparin    . niCARDipine     Assessment:  60 y/o female S/P RT common carotid artery angiogram,followed by intracranial angioplasty for symptomatic ICA stenosis.  Heparin, post-procedure, started at 500 units/hr per Dr. Corliss Skainseveshwar.  Pharmacy to manage until Heparin discontinued tomorrow AM.  Goal of Therapy: Post Interventional Radiology Procedure > Heparin Level  0.1 - 0.25 units/ml   Plan:  Continue Heparin at 500 units/hr.  Will check Heparin Level at 1900 pm.   Laurena BeringStramoski, Kourtland Coopman J,  Pharm.D     02/16/2016, 1:10 PM

## 2016-02-16 NOTE — H&P (Signed)
Chief Complaint: Patient was seen in consultation today for cerebral arteriogram with possible right internal carotid artery angioplasty/stnet placement at the request of Dr Ruta Hinds  Referring Physician(:) Dr Ruta Hinds  Supervising Physician: Julieanne Cotton  Patient Status: Outpatient  History of Present Illness: Leah Wolf is a 60 y.o. female   Hx CVA 2014 New recent CVA Hospitalized in [place] and referred to Dr Corliss Skains for evaluation and possible treatment 02/11/16 Cerebral arteriogram: IMPRESSION: Approximately 80-90% stenosis of the right internal carotid artery in the caval cavernous segment.  Angiographically occluded left internal carotid artery at the bulb site of the previous endarterectomy.  Extensive collateralization arising from the anterior temporal branch of the left external carotid artery, the middle meningeal branches, supplying the left temporal lobe.  Pitiful reconstitution of the left internal carotid artery in the petrous, cavernous and supraclinoid segments.  Extensive retrograde opacification of the left middle cerebral artery distribution from the right internal carotid artery via the anterior communicating artery, and also from the leptomeningeal branches, left posterior cerebral artery P3 segment, and from the lateral thalamus perforators from the left posterior cerebral artery P1 P2 junction.  Arteriosclerotic narrowing of the right posterior cerebral artery P1 region.  The angiographic findings were reviewed with the patient and her close friend. The patient was asked to continue taking her medications as prescribed, and also to maintain adequate hydration.  Now scheduled for R ICA PTA/Stent   Past Medical History  Diagnosis Date  . Anxiety   . Cervical pain (neck)   . Carotid artery occlusion     left  . Hypertension   . Hyperlipidemia   . Tobacco abuse   . Visual aura 2011  . Acute CVA  (cerebrovascular accident) [facility]) June 2014    Past Surgical History  Procedure Laterality Date  . Tonsillectomy    . Tubal ligation    . Endarterectomy Left 04/24/2013    Procedure: ENDARTERECTOMY CAROTID with patch angioplasty;  Surgeon: Nada Libman, MD;  Location: [facility] OR;  Service: Vascular;  Laterality: Left;  . Carotid endarterectomy Left Aug. 21, 2015    cea    Allergies: Percocet and Morphine and related  Medications: Prior to Admission medications   Medication Sig Start Date End Date Taking? Authorizing Provider  aspirin EC 81 MG EC tablet Take 1 tablet (81 mg total) by mouth daily. 04/25/13  Yes Lars Mage, PA-C  atorvastatin (LIPITOR) 80 MG tablet Take 80 mg by mouth daily.   Yes Historical Provider, MD  clopidogrel (PLAVIX) 75 MG tablet Take 75 mg by mouth daily.   Yes Historical Provider, MD  lisinopril (PRINIVIL,ZESTRIL) 20 MG tablet Take 1 tablet (20 mg total) by mouth daily. 05/06/13  Yes Nada Libman, MD  LORazepam (ATIVAN) 2 MG tablet Take 2 mg by mouth 4 (four) times daily.    Yes Historical Provider, MD  OLANZapine (ZYPREXA) 5 MG tablet Take 2.5 mg by mouth at bedtime as needed (For depression.).    Yes Historical Provider, MD  omeprazole (PRILOSEC) 20 MG capsule Take 20 mg by mouth every morning.    Yes Historical Provider, MD     Family History  Problem Relation Age of Onset  . Heart disease Mother   . Heart attack Mother   . Heart disease Brother     Before age 68  . Hyperlipidemia Brother   . Hypertension Brother   . Heart attack Brother   . Heart disease Brother   . Hyperlipidemia  Brother   . Hypertension Brother   . Heart attack Brother     Social History   Social History  . Marital Status: Single    Spouse Name: N/A  . Number of Children: N/A  . Years of Education: N/A   Social History Main Topics  . Smoking status: Current Every Day Smoker -- 40 years    Types: Cigarettes  . Smokeless tobacco: Never Used  . Alcohol Use: No  .  Drug Use: No  . Sexual Activity: Not Asked   Other Topics Concern  . None   Social History Narrative     Review of Systems: A 12 point ROS discussed and pertinent positives are indicated in the HPI above.  All other systems are negative.  Review of Systems  Constitutional: Negative for fever, activity change and fatigue.  HENT: Negative for facial swelling, tinnitus and trouble swallowing.   Eyes: Negative for visual disturbance.  Respiratory: Negative for cough and shortness of breath.   Cardiovascular: Negative for chest pain.  Gastrointestinal: Negative for abdominal pain.  Musculoskeletal: Negative for back pain.  Neurological: Positive for weakness.  Psychiatric/Behavioral: Negative for behavioral problems and confusion.    Vital Signs: There were no vitals taken for this visit.  Physical Exam  Constitutional: She is oriented to person, place, and time. She appears well-nourished.  HENT:  Head: Atraumatic.  Eyes: EOM are normal.  Neck: Neck supple.  Cardiovascular: Normal rate, regular rhythm and normal heart sounds.   No murmur heard. Pulmonary/Chest: Effort normal and breath sounds normal. She has no wheezes.  Abdominal: Soft. Bowel sounds are normal. There is no tenderness.  Musculoskeletal: Normal range of motion. She exhibits no edema or tenderness.  Neurological: She is alert and oriented to person, place, and time.  A/O x 3 Slow speech Answers all questions appropriately  Skin: Skin is warm and dry.  Psychiatric: She has a normal mood and affect. Her behavior is normal. Judgment and thought content normal.    Mallampati Score:  MD Evaluation Airway: WNL Heart: WNL Abdomen: WNL Chest/ Lungs: WNL ASA  Classification: 3 Mallampati/Airway Score: One  Imaging: Ir Radiologist Eval & Mgmt  02/15/2016  EXAM: NEW PATIENT OFFICE VISIT CHIEF COMPLAINT: Speech difficulties and left facial droop. 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 The patient is accompanied by a close friend. History was obtained from the patient and corroborated by her friend. The patient apparently had mild dysarthria from a previous ischemic stroke some years ago. On June 3rd she apparently was witnessed to have a sudden onset of confusion, right-sided upper extremity trembling followed by gait difficulties and difficulty speaking. The patient was then taken to a local medical facility where she was hydrated and was told nothing else could be done. She was then [facility] where she underwent a workup after having been seen by a neurologist. The workup which entailed MRI MRA of the brain revealed changes on the MRI suggestive of acute ischemic events involving both cerebral hemispheres left greater than right. She was started on Plavix in addition to aspirin which she was on previously. She was then given appointments for out patient physical therapy. The patient is referred here for evaluation and treatment of symptomatic bilateral internal carotid artery stenosis seen on the MRA examination. Since her discharge, the patient has mostly had symptoms of expressive  aphasia with word finding and also memory difficulties. Her motor function in terms of motor power, ability to coordinate and also gait have been reasonable to where she is independent. Her other difficulty is her inability to remember and writing skills. She is able to hold a fork and a knife and feed herself and also dress herself. She denies any seizures or loss of consciousness. She denies any recent chest pain, shortness of breath, wheezing difficulties or coughing. She denies any abdominal pain, nausea or vomiting, constipation, diarrhea or melena. She denies any palpitations or ankle swelling. Past Medical History: Cerebral vascular  accident in 2014. Mild hypertension. Anxiety. GERD. Carotid stenosis having undergone a left carotid endarterectomy about a year and half ago. Cholecystitis with chololithiasis and awaiting surgery. Medications: Plavix Lipitor.  Ativan.  Lisinopril.  Aspirin. Allergies: Percocet. Social History: Patient is divorced. Two brothers both deceased. The patient smokes up to half a pack of cigarettes per day. She denies drinking alcohol or using illicit chemicals. Family History: Significant for heart problems. Mother died in her 29s of Alzheimer's. Brother died of a MI. Another brother died also of an MI. REVIEW OF SYSTEMS: Essentially negative except for as mentioned above. PHYSICAL EXAMINATION: In no acute distress. Affect within normal limits. Word-finding difficulties, although she is aware of what she wants to say. Slow motor skills with her pin and writing. Otherwise, follows commands appropriately. Oriented to place, time and space. Has a mild left facial droop. Tongue is in the midline. Has no lateralizing weakness in either of her extremities. Coordination grossly intact except for fine motor skills of writing with her right hand. Walks steadily with slightly wide-based gait. ASSESSMENT AND PLAN: The patient's recent MRI MRA of the brain and CT angiogram were reviewed These depict a high-grade stenosis of the right internal carotid artery supraclinoid segment associated with a possible proximal focal outpouching. Reconstitution of the right middle cerebral artery and right anterior cerebral artery is noted. The left internal carotid artery demonstrates no clear flow signal suggestive of either occlusion or high-grade stenosis. Suggestion of reconstitution of flow signal is seen in in the left middle cerebral artery distribution though severely attenuated caliber noted more proximally of the left middle cerebral artery. Flow signal is demonstrated in the vertebral arteries and the basilar artery. The above  findings were reviewed with the patient and the patient's close friend. Given the fact that the patient has bilaterally symptomatic internal carotid artery stenosis and and/or occlusion on the left side, it was felt further evaluation with a formal catheter angiogram was indicated to accurately evaluate the degree of the stenosis intracranial and extracranially, and also to evaluate for collateral supply. Further recommendations to follow the results of the diagnostic catheter angiogram. The procedure, risks and benefits were all reviewed with the patient and the patient's friend. There are in agreement to proceed. This will be arranged as soon as possible. In the meantime, the patient has been asked to continue taking her dual antiplatelets, her statins, and also her hypertension pills, and to maintain adequate hydration. Electronically Signed   By: Julieanne Cotton M.D.   On: 02/11/2016 16:40   Ir Angio Intra Extracran Sel Com Carotid Innominate Bilat Mod Sed  02/15/2016  CLINICAL DATA:  Acute onset of a seizure and left-sided facial droop. Transient right-sided weakness. Abnormal MRI MRA of the brain. EXAM: BILATERAL COMMON CAROTID AND INNOMINATE ANGIOGRAPHY AND BILATERAL VERTEBRAL ARTERY ANGIOGRAMS PROCEDURE: Contrast: Isovue 300 approximately 60 mL Anesthesia/Sedation:  Conscious sedation. Medications: Versed  0.5 mg IV. Fentanyl 12.5 mcg IV. Heparin 1000 units IV. Following a full explanation of the procedure along with the potential associated complications, an informed witnessed consent was obtained. The right groin was prepped and draped in the usual sterile fashion. Thereafter using modified Seldinger technique, transfemoral access into the right common femoral artery was obtained without difficulty. Over a 0.035 inch guidewire, a 5 French Pinnacle sheath was inserted. Through this, and also over 0.035 inch guidewire, a 5 [place] JB 1 catheter was advanced to the aortic arch region and selectively  positioned in the right common carotid artery, the right vertebral artery, the left common carotid artery and the left vertebral artery. There were no acute complications. The patient tolerated the procedure well. FINDINGS: The right common carotid arteriogram demonstrates the right external carotid artery to be mildly narrowed at its origin. Its branches are normally opacified. The right internal carotid artery at the bulb demonstrates a narrowing of approximately 40-50% just distal to the bulb. More distally the vessel is seen to opacify normally to the cranial skull base. There is mild narrowing of the right internal carotid artery at the petrous cavernous junction. The caval segment of the right internal carotid artery demonstrates severe focal stenoses of approximately 80-90%. Distal to this the supraclinoid segment appears widely patent. A focal outpouching is seen from the right petrous cavernous segment seen on the AP projection. The right middle cerebral artery demonstrates mild narrowing at its proximal aspect. Its branches, otherwise, are normally opacified. The right anterior cerebral artery is seen to opacify into the capillary and venous phases. Also demonstrated is prompt opacification via the anterior communicating artery of the left anterior cerebral artery A2 segment and distally. There is nonvisualization of the left anterior cerebral artery A1 segment. Multiple collaterals are seen in the region of the left anterior cerebral artery proximal A1 segment. The delayed arterial phase in the AP projection demonstrates a retrograde opacification of the left middle cerebral artery distribution to the level of the sylvian triangle on the left. The right vertebral artery origin is normal. The vessel is seen to opacify normally to the cranial skull base. Normal opacification is seen in the right posterior-inferior cerebellar artery and the vertebrobasilar junction. The basilar artery, the superior  cerebellar arteries and the and the anterior-inferior cerebellar arteries are seen to opacify normally into the capillary and venous phases. There is moderate stenosis of the right posterior cerebral artery P1 segment and also of the left posterior cerebral artery P1 P2 segment. Extensive collaterals are seen from the lateral thalamus perforators retrogradely opacifying the superior posterior thalamic region. Also demonstrated are leptomeningeal collaterals opacifying the left posterior parietal regions. The left vertebral artery origin is normal. The vessel is seen to opacify normally to the cranial skull base. Normal opacification is seen of the left posterior-inferior cerebellar artery and the left vertebrobasilar junction. The basilar artery, the superior cerebellar arteries and the anterior-inferior cerebellar arteries are seen to opacify into the capillary and venous phases. Again demonstrated is the focal stenoses of the right posterior cerebral artery P1 segment. Extensive thalamus perforators are seen on the left from the proximal P1 P2 junction. The left common carotid arteriogram demonstrates evidence of previous endarterectomy at the level of C4-C5. There is complete occlusion of the left internal carotid artery at the level of the bulb associated with a stump. Moderate atherosclerotic changes are seen involving the left common carotid artery at the site of the proximal portion of the graft. Patency of  the left external carotid artery is seen. Intracranially there is partial reconstitution of the petrous, the cavernous and the supraclinoid segments which are significantly attenuated. Extensive collaterals are seen arising from the anterior temporal branch of the left external carotid artery, the middle meningeal artery branches which reconstitute the left temporal lobe. Also noted is diminutive opacification of the left middle cerebral artery proximally. IMPRESSION: Approximately 80-90% stenosis of the  right internal carotid artery in the caval cavernous segment. Angiographically occluded left internal carotid artery at the bulb site of the previous endarterectomy. Extensive collateralization arising from the anterior temporal branch of the left external carotid artery, the middle meningeal branches, supplying the left temporal lobe. Pitiful reconstitution of the left internal carotid artery in the petrous, cavernous and supraclinoid segments. Extensive retrograde opacification of the left middle cerebral artery distribution from the right internal carotid artery via the anterior communicating artery, and also from the leptomeningeal branches, left posterior cerebral artery P3 segment, and from the lateral thalamus perforators from the left posterior cerebral artery P1 P2 junction. Arteriosclerotic narrowing of the right posterior cerebral artery P1 region. The angiographic findings were reviewed with the patient and her close friend. The patient was asked to continue taking her medications as prescribed, and also to maintain adequate hydration. Electronically Signed   By: Julieanne Cotton M.D.   On: 02/11/2016 15:11   Ir Angio Vertebral Sel Vertebral Bilat Mod Sed  02/15/2016  CLINICAL DATA:  Acute onset of a seizure and left-sided facial droop. Transient right-sided weakness. Abnormal MRI MRA of the brain. EXAM: BILATERAL COMMON CAROTID AND INNOMINATE ANGIOGRAPHY AND BILATERAL VERTEBRAL ARTERY ANGIOGRAMS PROCEDURE: Contrast: Isovue 300 approximately 60 mL Anesthesia/Sedation:  Conscious sedation. Medications: Versed 0.5 mg IV. Fentanyl 12.5 mcg IV. Heparin 1000 units IV. Following a full explanation of the procedure along with the potential associated complications, an informed witnessed consent was obtained. The right groin was prepped and draped in the usual sterile fashion. Thereafter using modified Seldinger technique, transfemoral access into the right common femoral artery was obtained without  difficulty. Over a 0.035 inch guidewire, a 5 French Pinnacle sheath was inserted. Through this, and also over 0.035 inch guidewire, a 5 [place] JB 1 catheter was advanced to the aortic arch region and selectively positioned in the right common carotid artery, the right vertebral artery, the left common carotid artery and the left vertebral artery. There were no acute complications. The patient tolerated the procedure well. FINDINGS: The right common carotid arteriogram demonstrates the right external carotid artery to be mildly narrowed at its origin. Its branches are normally opacified. The right internal carotid artery at the bulb demonstrates a narrowing of approximately 40-50% just distal to the bulb. More distally the vessel is seen to opacify normally to the cranial skull base. There is mild narrowing of the right internal carotid artery at the petrous cavernous junction. The caval segment of the right internal carotid artery demonstrates severe focal stenoses of approximately 80-90%. Distal to this the supraclinoid segment appears widely patent. A focal outpouching is seen from the right petrous cavernous segment seen on the AP projection. The right middle cerebral artery demonstrates mild narrowing at its proximal aspect. Its branches, otherwise, are normally opacified. The right anterior cerebral artery is seen to opacify into the capillary and venous phases. Also demonstrated is prompt opacification via the anterior communicating artery of the left anterior cerebral artery A2 segment and distally. There is nonvisualization of the left anterior cerebral artery A1 segment. Multiple collaterals are seen  in the region of the left anterior cerebral artery proximal A1 segment. The delayed arterial phase in the AP projection demonstrates a retrograde opacification of the left middle cerebral artery distribution to the level of the sylvian triangle on the left. The right vertebral artery origin is normal. The  vessel is seen to opacify normally to the cranial skull base. Normal opacification is seen in the right posterior-inferior cerebellar artery and the vertebrobasilar junction. The basilar artery, the superior cerebellar arteries and the and the anterior-inferior cerebellar arteries are seen to opacify normally into the capillary and venous phases. There is moderate stenosis of the right posterior cerebral artery P1 segment and also of the left posterior cerebral artery P1 P2 segment. Extensive collaterals are seen from the lateral thalamus perforators retrogradely opacifying the superior posterior thalamic region. Also demonstrated are leptomeningeal collaterals opacifying the left posterior parietal regions. The left vertebral artery origin is normal. The vessel is seen to opacify normally to the cranial skull base. Normal opacification is seen of the left posterior-inferior cerebellar artery and the left vertebrobasilar junction. The basilar artery, the superior cerebellar arteries and the anterior-inferior cerebellar arteries are seen to opacify into the capillary and venous phases. Again demonstrated is the focal stenoses of the right posterior cerebral artery P1 segment. Extensive thalamus perforators are seen on the left from the proximal P1 P2 junction. The left common carotid arteriogram demonstrates evidence of previous endarterectomy at the level of C4-C5. There is complete occlusion of the left internal carotid artery at the level of the bulb associated with a stump. Moderate atherosclerotic changes are seen involving the left common carotid artery at the site of the proximal portion of the graft. Patency of the left external carotid artery is seen. Intracranially there is partial reconstitution of the petrous, the cavernous and the supraclinoid segments which are significantly attenuated. Extensive collaterals are seen arising from the anterior temporal branch of the left external carotid artery, the  middle meningeal artery branches which reconstitute the left temporal lobe. Also noted is diminutive opacification of the left middle cerebral artery proximally. IMPRESSION: Approximately 80-90% stenosis of the right internal carotid artery in the caval cavernous segment. Angiographically occluded left internal carotid artery at the bulb site of the previous endarterectomy. Extensive collateralization arising from the anterior temporal branch of the left external carotid artery, the middle meningeal branches, supplying the left temporal lobe. Pitiful reconstitution of the left internal carotid artery in the petrous, cavernous and supraclinoid segments. Extensive retrograde opacification of the left middle cerebral artery distribution from the right internal carotid artery via the anterior communicating artery, and also from the leptomeningeal branches, left posterior cerebral artery P3 segment, and from the lateral thalamus perforators from the left posterior cerebral artery P1 P2 junction. Arteriosclerotic narrowing of the right posterior cerebral artery P1 region. The angiographic findings were reviewed with the patient and her close friend. The patient was asked to continue taking her medications as prescribed, and also to maintain adequate hydration. Electronically Signed   By: Julieanne Cotton M.D.   On: 02/11/2016 15:11    Labs:  CBC:  Recent Labs  02/11/16 1045  WBC 6.3  HGB 13.6  HCT 41.8  PLT 192    COAGS:  Recent Labs  02/11/16 1045  INR 0.98  APTT 28    BMP:  Recent Labs  02/11/16 1045  NA 138  K 3.6  CL 105  CO2 24  GLUCOSE 123*  BUN 7  CALCIUM 9.2  CREATININE 0.80  GFRNONAA >60  GFRAA >60    LIVER FUNCTION TESTS: No results for input(s): BILITOT, AST, ALT, ALKPHOS, PROT, ALBUMIN in the last 8760 hours.  TUMOR MARKERS: No results for input(s): AFPTM, CEA, CA199, CHROMGRNA in the last 8760 hours.  Assessment and Plan:  CVA 2014 and new CVA 02/06/16 Angio  reveals R ICA stenosis For angioplasty/stent in IR today Risks and Benefits discussed with the patient including, but not limited to bleeding, infection, vascular injury, contrast induced renal failure, stroke or even death. All of the patient's questions were answered, patient is agreeable to proceed. Consent signed and in chart. Pt aware if intervention is performed she will be admitted to Neuro ICU overnight and plan for discharge in am Agreeable to proceed  Thank you for this interesting consult.  I greatly enjoyed meeting ABBYGAYLE HELFAND and look forward to participating in their care.  A copy of this report was sent to the requesting provider on this date.  Electronically Signed: Vian Fluegel A 02/16/2016, 7:58 AM   I spent a total of  30 Minutes   in face to face in clinical consultation, greater than 50% of which was counseling/coordinating care for cerebral arteriogram with R ICA pta/stent

## 2016-02-16 NOTE — Progress Notes (Signed)
ANTICOAGULATION CONSULT NOTE - Follow Up Consult  Pharmacy Consult for heparin  Indication: Post-carotid artery angiogram for ICA stenosis  Allergies  Allergen Reactions  . Percocet [Oxycodone-Acetaminophen] Anxiety  . Morphine And Related Other (See Comments)    Reaction unknown    Patient Measurements: Height: 5\' 3"  (160 cm) Weight: 156 lb 12 oz (71.1 kg) IBW/kg (Calculated) : 52.4  Vital Signs: Temp: 97.9 F (36.6 C) (06/14 1630) Temp Source: Oral (06/14 1630) BP: 120/85 mmHg (06/14 1900) Pulse Rate: 69 (06/14 1900)  Labs:  Recent Labs  02/16/16 0738 02/16/16 1831  HGB 13.2  --   HCT 40.5  --   PLT 242  --   APTT 31  --   LABPROT 13.7  --   INR 1.03  --   HEPARINUNFRC  --  0.62  CREATININE 0.79  --     Estimated Creatinine Clearance: 69.8 mL/min (by C-G formula based on Cr of 0.79).   Medications:  Scheduled:  . [START ON 02/17/2016] aspirin  325 mg Oral Q breakfast  . [START ON 02/17/2016] clopidogrel  75 mg Oral Q breakfast  . iopamidol      . lidocaine      . LORazepam  2 mg Oral Q8H  . nitroGLYCERIN       Infusions:  . sodium chloride 75 mL/hr at 02/16/16 1754  . heparin 500 Units/hr (02/16/16 1300)  . niCARDipine      Assessment: 60 y/o female S/P RT common carotid artery angiogram,followed by intracranial angioplasty for symptomatic ICA stenosis. Heparin, post-procedure, started at 500 units/hr per Dr. Corliss Skainseveshwar. Heparin to be off at 7am. -heparin level= 0.62  Goal of Therapy:  Heparin level 0.1-0.25 units/ml Monitor platelets by anticoagulation protocol: Yes   Plan:  -Decrease heparin to 300 units/hr -Daily heparin level and CBC  Harland GermanAndrew Ayan Heffington, Pharm D 02/16/2016 7:46 PM

## 2016-02-16 NOTE — Anesthesia Postprocedure Evaluation (Signed)
Anesthesia Post Note  Patient: Leah Wolf  Procedure(s) Performed: Procedure(s) (LRB): STENT PLACEMENT   (RADIOLOGY WITH ANESTHESIA) (N/A)  Patient location during evaluation: PACU Anesthesia Type: General Level of consciousness: awake, oriented and awake and alert Pain management: pain level controlled Vital Signs Assessment: post-procedure vital signs reviewed and stable Respiratory status: spontaneous breathing, nonlabored ventilation and respiratory function stable Cardiovascular status: blood pressure returned to baseline    Last Vitals:  Filed Vitals:   02/16/16 1345 02/16/16 1400  BP: 118/51 116/52  Pulse: 76 79  Temp:    Resp: 31 22    Last Pain:  Filed Vitals:   02/16/16 1413  PainSc: 0-No pain                 Lavone Weisel COKER

## 2016-02-16 NOTE — Progress Notes (Signed)
eLink Physician-Brief Progress Note Patient Name: Sharren BridgeDeborah C Sinagra DOB: 05/23/1956 MRN: [id number]   Date of Service  02/16/2016  HPI/Events of Note  Planned angioplasty stent R ICA Stable on camera check  eICU Interventions  No eICU intervention needed     Intervention Category Evaluation Type: New Patient Evaluation  Max FickleDouglas Dreyton Roessner 02/16/2016, 6:32 PM

## 2016-02-16 NOTE — Progress Notes (Signed)
    Reason for visit: Post op (R)ICA angioplasty  Subjective: Pt seen in Neuro PACU, feeling well. Denies HA, N/V. Has tried some ice chips, no swallowing troubles Seems a little delayed following commands  Objective: Physical Exam: BP 119/61 mmHg  Pulse 79  Temp(Src) 96.8 F (36 C)  Resp 23  Ht 5\' 4"  (1.626 m)  Wt 155 lb (70.308 kg)  BMI 26.59 kg/m2  SpO2 100% Awake and Alert Possible slight drift on the left. Face symmetric Tongue midline Fine motor intact Strength 5/5 UE and LE Lungs: CTA Heart: regular Ext: (R)groin soft, NT, no hematoma    Labs: CBC  Recent Labs  02/16/16 0738  WBC 6.1  HGB 13.2  HCT 40.5  PLT 242   BMET  Recent Labs  02/16/16 0738  NA 136  K 4.2  CL 101  CO2 27  GLUCOSE 122*  BUN 8  CREATININE 0.79  CALCIUM 9.2   LFT  Recent Labs  02/16/16 0738  PROT 6.5  ALBUMIN 3.5  AST 15  ALT 13*  ALKPHOS 43  BILITOT 0.2*   PT/INR  Recent Labs  02/16/16 0738  LABPROT 13.7  INR 1.03    Assessment/Plan: S/p (R)ICA angioplasty Hep gtt overnight Probable DC tomorrow. Needs to resume PT/OT/ST after discharge.    LOS: 0 days    Brayton ElBRUNING, Sharronda Schweers PA-C 02/16/2016 4:05 PM

## 2016-02-16 NOTE — Anesthesia Preprocedure Evaluation (Signed)
Anesthesia Evaluation  Patient identified by MRN, date of birth, ID band Patient awake    Reviewed: Allergy & Precautions, NPO status , Patient's Chart, lab work & pertinent test results  Airway Mallampati: II  TM Distance: >3 FB Neck ROM: Full    Dental  (+) Teeth Intact, Dental Advisory Given   Pulmonary Current Smoker,    breath sounds clear to auscultation       Cardiovascular hypertension,  Rhythm:Regular Rate:Normal     Neuro/Psych    GI/Hepatic   Endo/Other    Renal/GU      Musculoskeletal   Abdominal   Peds  Hematology   Anesthesia Other Findings   Reproductive/Obstetrics                            Anesthesia Physical Anesthesia Plan  ASA: III  Anesthesia Plan: General   Post-op Pain Management:    Induction: Intravenous  Airway Management Planned: Oral ETT  Additional Equipment:   Intra-op Plan:   Post-operative Plan: Extubation in OR  Informed Consent: I have reviewed the patients History and Physical, chart, labs and discussed the procedure including the risks, benefits and alternatives for the proposed anesthesia with the patient or authorized representative who has indicated his/her understanding and acceptance.   Dental advisory given  Plan Discussed with: CRNA and Anesthesiologist  Anesthesia Plan Comments:         Anesthesia Quick Evaluation  

## 2016-02-16 NOTE — Transfer of Care (Signed)
Immediate Anesthesia Transfer of Care Note  Patient: Leah Wolf  Procedure(s) Performed: Procedure(s): STENT PLACEMENT   (RADIOLOGY WITH ANESTHESIA) (N/A)  Patient Location: PACU  Anesthesia Type:General  Level of Consciousness: awake and alert   Airway & Oxygen Therapy: Patient Spontanous Breathing and Patient connected to nasal cannula oxygen  Post-op Assessment: Report given to RN, Post -op Vital signs reviewed and stable, Patient moving all extremities X 4 and Patient able to stick tongue midline  Post vital signs: Reviewed and stable  Last Vitals:  Filed Vitals:   02/16/16 1257 02/16/16 1300  BP: 116/48   Pulse: 85 84  Temp:    Resp: 31 29    Last Pain: There were no vitals filed for this visit.       Complications: No apparent anesthesia complications

## 2016-02-17 ENCOUNTER — Encounter (HOSPITAL_COMMUNITY): Payer: Self-pay | Admitting: Interventional Radiology

## 2016-02-17 LAB — BASIC METABOLIC PANEL
Anion gap: 6 (ref 5–15)
BUN: 5 mg/dL — AB (ref 6–20)
CALCIUM: 8.6 mg/dL — AB (ref 8.9–10.3)
CO2: 25 mmol/L (ref 22–32)
Chloride: 108 mmol/L (ref 101–111)
Creatinine, Ser: 0.62 mg/dL (ref 0.44–1.00)
GFR calc Af Amer: >60 mL/min (ref >60)
GFR calc non Af Amer: >60 mL/min (ref >60)
GLUCOSE: 125 mg/dL — AB (ref 65–99)
Potassium: 4.6 mmol/L (ref 3.5–5.1)
Sodium: 139 mmol/L (ref 135–145)

## 2016-02-17 LAB — CBC WITH DIFFERENTIAL/PLATELET
BASOS ABS: 0 10*3/uL (ref 0.0–0.1)
BASOS PCT: 0 %
EOS PCT: 0 %
Eosinophils Absolute: 0 10*3/uL (ref 0.0–0.7)
HCT: 35.2 % — ABNORMAL LOW (ref 36.0–46.0)
Hemoglobin: 11.3 g/dL — ABNORMAL LOW (ref 12.0–15.0)
LYMPHS PCT: 15 %
Lymphs Abs: 1.2 10*3/uL (ref 0.7–4.0)
MCH: 29.9 pg (ref 26.0–34.0)
MCHC: 32.1 g/dL (ref 30.0–36.0)
MCV: 93.1 fL (ref 78.0–100.0)
MONO ABS: 0.5 10*3/uL (ref 0.1–1.0)
Monocytes Relative: 6 %
NEUTROS ABS: 6.4 10*3/uL (ref 1.7–7.7)
Neutrophils Relative %: 79 %
PLATELETS: 222 10*3/uL (ref 150–400)
RBC: 3.78 MIL/uL — AB (ref 3.87–5.11)
RDW: 12.3 % (ref 11.5–15.5)
WBC: 8 10*3/uL (ref 4.0–10.5)

## 2016-02-17 LAB — MRSA PCR SCREENING: MRSA BY PCR: NEGATIVE

## 2016-02-17 LAB — HEPARIN LEVEL (UNFRACTIONATED)

## 2016-02-17 NOTE — Progress Notes (Signed)
Pt. Ambulated the unit times one without difficulty.

## 2016-02-17 NOTE — Progress Notes (Signed)
Discharge instructions given. IV removed without difficulty. All questions answered. Pt. Transferred to POV via WC by NT.

## 2016-02-17 NOTE — Discharge Summary (Signed)
Patient ID: Leah BridgeDeborah C Yoshida MRN: [id number] DOB/AGE: 62/09/1955 62 y.o.  Admit date: 02/16/2016 Discharge date: 02/17/2016  Supervising Physician: Julieanne Cottoneveshwar, Sanjeev  Admission Diagnoses: CVA; Right internal carotid artery stenosis  Discharge Diagnoses:  Active Problems:   Internal carotid artery stenosis   Discharged Condition: stable; improved  Hospital Course:    Hx CVA 2014; new CVA 01/2016 Angiogram revealed R Internal Carotid Artery stenosis. Scheduled for angioplasty with Dr Corliss Skainseveshwar and performed 02/16/16 Tolerated well; overnight stay in Neuro ICU without event Slept well Eating well; urinating on own Speech still slow and word finding---no changes in that at this time Denies N/V/D; denies headache or pain Has ambulated with help Dr Corliss Skainseveshwar has seen and examined pt Plan for discharge now 2 week follow up. Plan also to continue speech therapy.  Consults: None  Significant Diagnostic Studies: cerebral arteriogram  Treatments:  Procedures    CEREBRAL ANGIOGRAM [[id number] (Custom)]      Expand All Collapse All   S/P RT common carotid artery angiogram,followed by intracranial angioplasty for symptomatic ICA stenosis with a resiual stenosis of less than 10 %       Discharge Exam: Blood pressure 115/59, pulse 80, temperature 98 F (36.7 C), temperature source Oral, resp. rate 17, height 5\' 3"  (1.6 m), weight 156 lb 12 oz (71.1 kg), SpO2 100 %.   A/O Pleasant lady Speech slow; word finding Face symmetrical; smile = Puffs cheeks = Heart: RRR Lungs: CTA Abd: soft; +BS Extr: FROM; Rt groin NT; No bleeding; No hematoma Rt foot 2+ pulses UOP: great yellow Has urinated on own  Results for orders placed or performed during the hospital encounter of 02/16/16  MRSA PCR Screening  Result Value Ref Range   MRSA by PCR NEGATIVE NEGATIVE  Heparin level (unfractionated)  Result Value Ref Range   Heparin Unfractionated 0.62 0.30 - 0.70 IU/mL  Basic  metabolic panel  Result Value Ref Range   Sodium 139 135 - 145 mmol/L   Potassium 4.6 3.5 - 5.1 mmol/L   Chloride 108 101 - 111 mmol/L   CO2 25 22 - 32 mmol/L   Glucose, Bld 125 (H) 65 - 99 mg/dL   BUN 5 (L) 6 - 20 mg/dL   Creatinine, Ser 1.300.62 0.44 - 1.00 mg/dL   Calcium 8.6 (L) 8.9 - 10.3 mg/dL   GFR calc non Af Amer >60 >60 mL/min   GFR calc Af Amer >60 >60 mL/min   Anion gap 6 5 - 15  Heparin level (unfractionated)  Result Value Ref Range   Heparin Unfractionated <0.10 (L) 0.30 - 0.70 IU/mL  CBC with Differential/Platelet  Result Value Ref Range   WBC 8.0 4.0 - 10.5 K/uL   RBC 3.78 (L) 3.87 - 5.11 MIL/uL   Hemoglobin 11.3 (L) 12.0 - 15.0 g/dL   HCT 86.535.2 (L) 78.436.0 - 69.646.0 %   MCV 93.1 78.0 - 100.0 fL   MCH 29.9 26.0 - 34.0 pg   MCHC 32.1 30.0 - 36.0 g/dL   RDW 29.512.3 28.411.5 - 13.215.5 %   Platelets 222 150 - 400 K/uL   Neutrophils Relative % 79 %   Neutro Abs 6.4 1.7 - 7.7 K/uL   Lymphocytes Relative 15 %   Lymphs Abs 1.2 0.7 - 4.0 K/uL   Monocytes Relative 6 %   Monocytes Absolute 0.5 0.1 - 1.0 K/uL   Eosinophils Relative 0 %   Eosinophils Absolute 0.0 0.0 - 0.7 K/uL   Basophils Relative 0 %   Basophils Absolute  0.0 0.0 - 0.1 K/uL      Disposition: R ICA angioplasty 6/14 in IR with Dr Corliss Skains Doing well Discharge to home now with friend Continue all home meds; continue ASA/Plavix daily Follow up 2 weeks Friend will have Speech Therapy in [place] supply [place] with needs for pt to continue therapy All have good understanding of dc instructions   Discharge Instructions    Call MD for:  difficulty breathing, headache or visual disturbances    Complete by:  As directed      Call MD for:  extreme fatigue    Complete by:  As directed      Call MD for:  hives    Complete by:  As directed      Call MD for:  persistant dizziness or light-headedness    Complete by:  As directed      Call MD for:  persistant nausea and vomiting    Complete by:  As directed      Call MD for:   redness, tenderness, or signs of infection (pain, swelling, redness, odor or green/yellow discharge around incision site)    Complete by:  As directed      Call MD for:  severe uncontrolled pain    Complete by:  As directed      Call MD for:  temperature >100.4    Complete by:  As directed      Diet - low sodium heart healthy    Complete by:  As directed      Discharge instructions    Complete by:  As directed   2 week follow up with Dr Corliss Skains; scheduler will call with time and date; call [phone] if questions or concerns; continue home meds; ASA/Plavix daily     Discharge wound care:    Complete by:  As directed   May shower today; keep clean bandaid on Rt groin daily x 2 weeks     Driving Restrictions    Complete by:  As directed   No driving 2 weeks     Increase activity slowly    Complete by:  As directed      Lifting restrictions    Complete by:  As directed   No lifting over 10 lbs x 1 week            Medication List    TAKE these medications        aspirin 81 MG EC tablet  Take 1 tablet (81 mg total) by mouth daily.     atorvastatin 80 MG tablet  Commonly known as:  LIPITOR  Take 80 mg by mouth daily.     clopidogrel 75 MG tablet  Commonly known as:  PLAVIX  Take 75 mg by mouth daily.     lisinopril 20 MG tablet  Commonly known as:  PRINIVIL,ZESTRIL  Take 1 tablet (20 mg total) by mouth daily.     LORazepam 2 MG tablet  Commonly known as:  ATIVAN  Take 2 mg by mouth 4 (four) times daily.     OLANZapine 5 MG tablet  Commonly known as:  ZYPREXA  Take 2.5 mg by mouth at bedtime as needed (For depression.).     omeprazole 20 MG capsule  Commonly known as:  PRILOSEC  Take 20 mg by mouth every morning.           Follow-up Information    Follow up with DEVESHWAR, Grandville Silos, MD In 2 weeks.   Specialty:  Interventional Radiology  Why:  pt will hear from IR scheduler for 2 week follow up time and date   Contact information:   [address] [place] [address] [phone]        Electronically Signed: Ralene Muskrat A 02/17/2016, 11:32 AM   I have spent Greater Than 30 Minutes discharging Leah Wolf.

## 2016-02-17 NOTE — Progress Notes (Signed)
ANTICOAGULATION CONSULT NOTE - Follow Up Consult  Pharmacy Consult for heparin  Indication: Post-carotid artery angiogram for ICA stenosis  Allergies  Allergen Reactions  . Percocet [Oxycodone-Acetaminophen] Anxiety  . Morphine And Related Other (See Comments)    Reaction unknown    Patient Measurements: Height: 5\' 3"  (160 cm) Weight: 156 lb 12 oz (71.1 kg) IBW/kg (Calculated) : 52.4  Vital Signs: Temp: 98.2 F (36.8 C) (06/15 0400) Temp Source: Oral (06/15 0400) BP: 123/75 mmHg (06/15 0500) Pulse Rate: 77 (06/15 0500)  Labs:  Recent Labs  02/16/16 0738 02/16/16 1831 02/17/16 0452  HGB 13.2  --   --   HCT 40.5  --   --   PLT 242  --   --   APTT 31  --   --   LABPROT 13.7  --   --   INR 1.03  --   --   HEPARINUNFRC  --  0.62 <0.10*  CREATININE 0.79  --  0.62    Estimated Creatinine Clearance: 69.8 mL/min (by C-G formula based on Cr of 0.62).   Medications:  Scheduled:  . aspirin  325 mg Oral Q breakfast  . clopidogrel  75 mg Oral Q breakfast  . LORazepam  2 mg Oral Q8H   Infusions:  . sodium chloride 75 mL/hr at 02/17/16 0500  . heparin 300 Units/hr (02/17/16 0500)  . niCARDipine      Assessment: 60 y/o female S/P RT common carotid artery angiogram,followed by intracranial angioplasty for symptomatic ICA stenosis. Pt on heparin post-procedure - to be turned off at 0700. Heparin level now undetectable on 300 units/hr.  Goal of Therapy:  Heparin level 0.1-0.25 units/ml Monitor platelets by anticoagulation protocol: Yes   Plan:  Continue heparin at 300 units/hr - will not adjust as heparin off in 45 min  Christoper Fabianaron Gina Leblond, PharmD, BCPS Clinical pharmacist, pager [phone]   02/17/2016 6:05 AM

## 2016-03-02 ENCOUNTER — Ambulatory Visit (HOSPITAL_COMMUNITY)
Admission: RE | Admit: 2016-03-02 | Discharge: 2016-03-02 | Disposition: A | Discharge status: Home / Self Care | Payer: Medicare Other | Source: Ambulatory Visit | Attending: Radiology | Admitting: Radiology

## 2016-03-02 DIAGNOSIS — I6521 Occlusion and stenosis of right carotid artery: Secondary | ICD-10-CM

## 2016-03-10 ENCOUNTER — Telehealth (HOSPITAL_COMMUNITY): Payer: Self-pay | Admitting: Interventional Radiology

## 2016-03-10 NOTE — Telephone Encounter (Signed)
Pt's significant other contacted me via pager this evening to let me know that Leah Wolf would run out of her prescription of Plavix 75mg  1 tablet daily tomorrow (Saturday) and she needs a refill today. The patient uses [facility] in [place], [place]. After speaking with Dr. Corliss Skainseveshwar and receiving permission for the refill I called the pharmacy and left a VM for the patient to have her Plavix 75mg  1 tablet daily #30 tablets with 3 refills. JM

## 2016-03-11 ENCOUNTER — Telehealth (HOSPITAL_COMMUNITY): Payer: Self-pay | Admitting: Interventional Radiology

## 2016-03-11 NOTE — Telephone Encounter (Signed)
Pt's significant other paged me again this am stating that he went to the pharmacy and they said they had not gotten a refill request from Dr. Fatima Sangereveshwar's office. After speaking with Dr. Corliss Skainseveshwar he is very uncomfortable with refilling this prescription over the weekend. I called and spoke directly to the pharmacy and advised him to disregard the request for the refill of the patient's Plavix. I told him that if he wanted to give the patient 1-2 pills to get her through until Monday when our office reopens that would be up to him. The pharmacist states understanding and agrees not to refill the prescription for Plavix at this time. I told him that we would be in contact with the patient on Monday to render this situation. He agrees. JM

## 2016-03-13 ENCOUNTER — Telehealth (HOSPITAL_COMMUNITY): Payer: Self-pay | Admitting: *Deleted

## 2016-03-13 NOTE — Telephone Encounter (Signed)
Called pharmacy, original RX for Plavix was from the local ED.  No other RX for plavix on record.  Called in 3 refills.

## 2016-03-13 NOTE — Telephone Encounter (Signed)
Called and spoke with patient.  Let her know that the refills for Plavix had been called in to [facility] [place].  Pt voiced understanding

## 2016-04-24 ENCOUNTER — Ambulatory Visit: Payer: Medicare Other | Admitting: Surgery

## 2016-04-24 ENCOUNTER — Encounter (HOSPITAL_COMMUNITY): Payer: Medicare Other

## 2016-06-29 ENCOUNTER — Telehealth (HOSPITAL_COMMUNITY): Payer: Self-pay

## 2016-06-29 NOTE — Telephone Encounter (Signed)
Called to schedule 4 month f/u angiogram. Left message for pt to return call. AW

## 2016-08-03 ENCOUNTER — Telehealth (HOSPITAL_COMMUNITY): Payer: Self-pay

## 2016-08-03 NOTE — Telephone Encounter (Signed)
Called to schedule f/u angio, left message for pt to return call. AW 

## 2016-10-06 ENCOUNTER — Telehealth (HOSPITAL_COMMUNITY): Payer: Self-pay

## 2016-10-06 NOTE — Telephone Encounter (Signed)
Called to schedule f/u angio, left vm. AW 

## 2016-12-14 ENCOUNTER — Telehealth (HOSPITAL_COMMUNITY): Payer: Self-pay

## 2016-12-14 NOTE — Telephone Encounter (Signed)
Called to schedule angio, no answer, no vm. AW 

## 2017-01-18 ENCOUNTER — Encounter (HOSPITAL_COMMUNITY): Payer: Self-pay | Admitting: Radiology

## 2017-05-27 IMAGING — XA IR ANGIO INTRA EXTRACRAN SEL COM CAROTID INNOMINATE BILAT MOD SE
1 series · 12 of 24 positions shown · IV contrast (IODINE)
Comparison: none

CLINICAL DATA: Acute onset of a seizure and left-sided facial
droop. Transient right-sided weakness. Abnormal MRI MRA of the
brain.

[Series 300: dr. (person_name) · 12 of 148 slices shown]
[im 7/148]
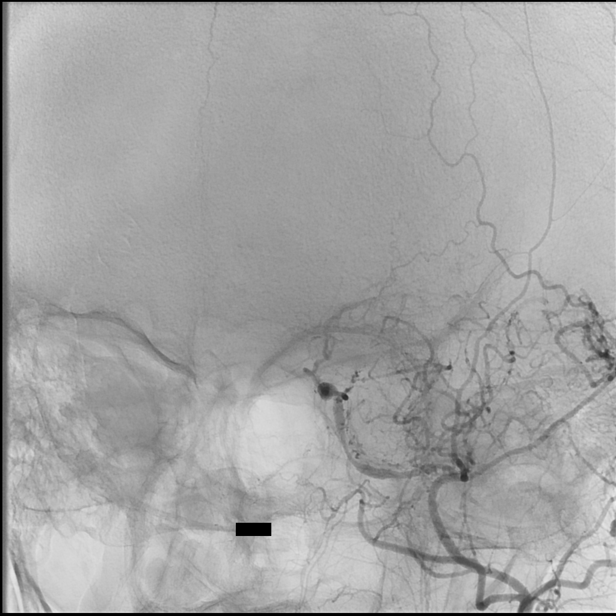
[im 20/148]
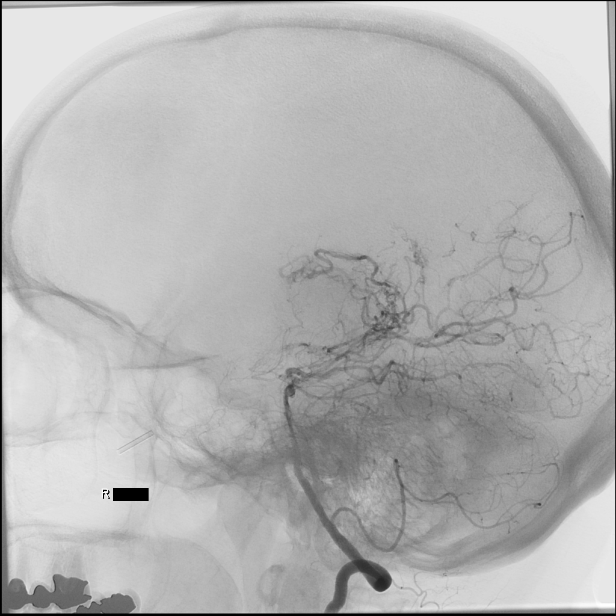
[im 32/148]
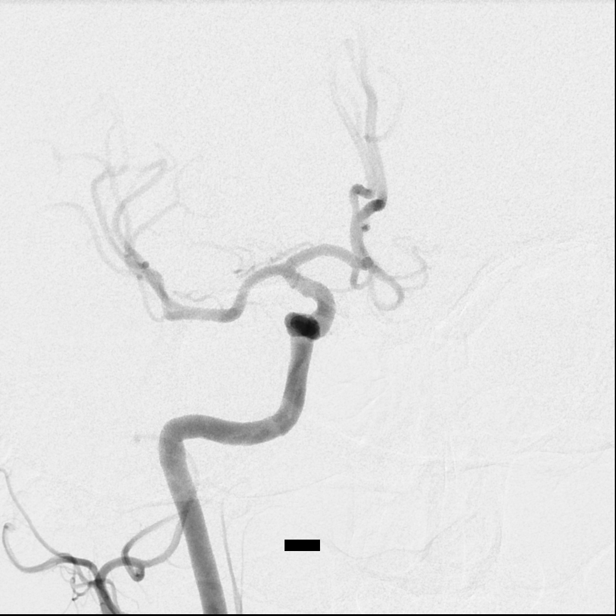
[im 45/148]
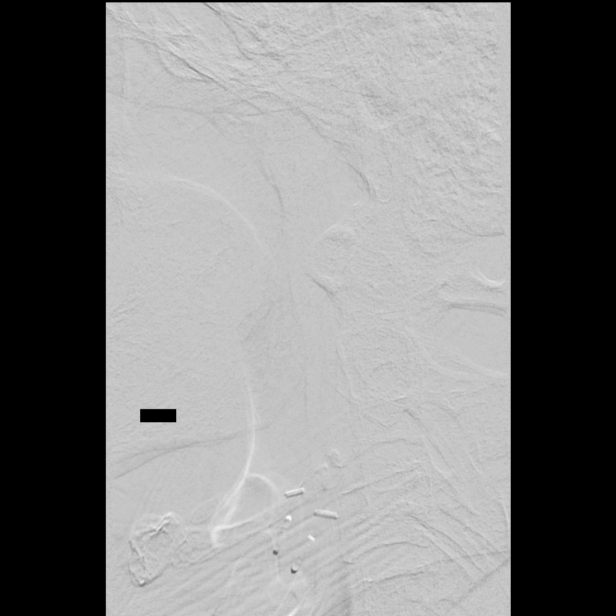
[im 58/148]
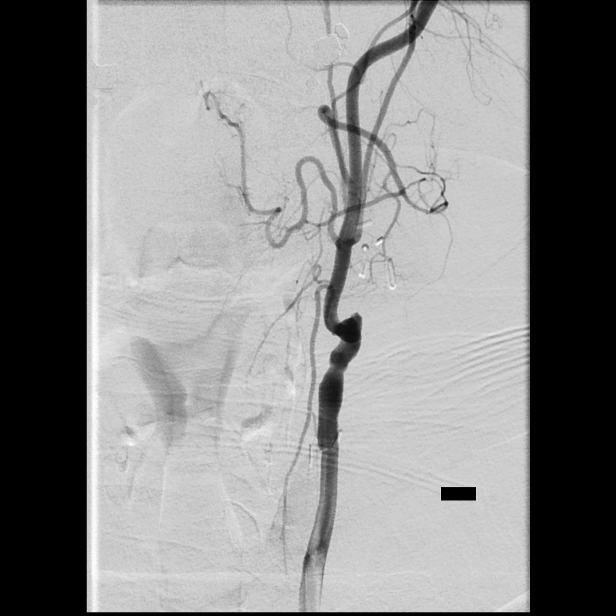
[im 71/148]
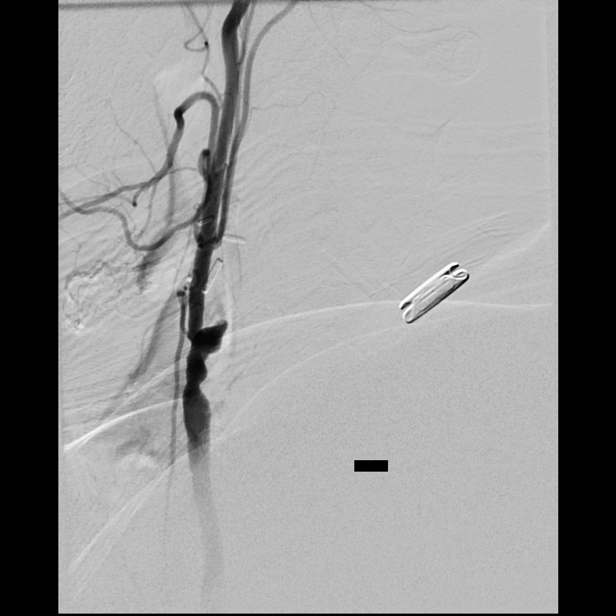
[im 84/148]
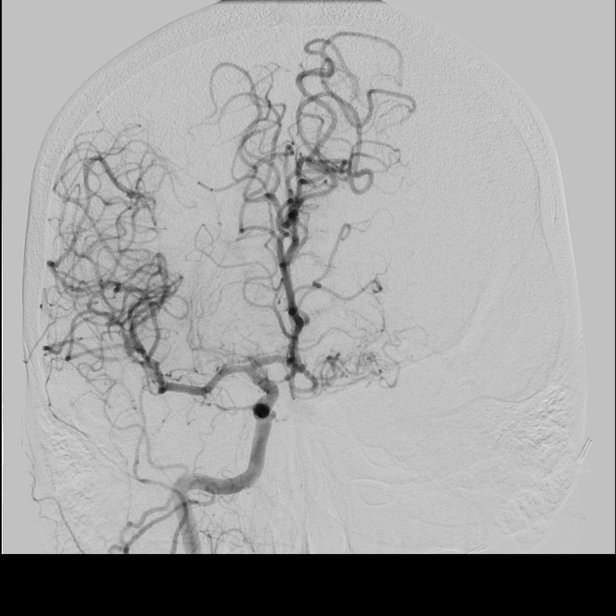
[im 96/148]
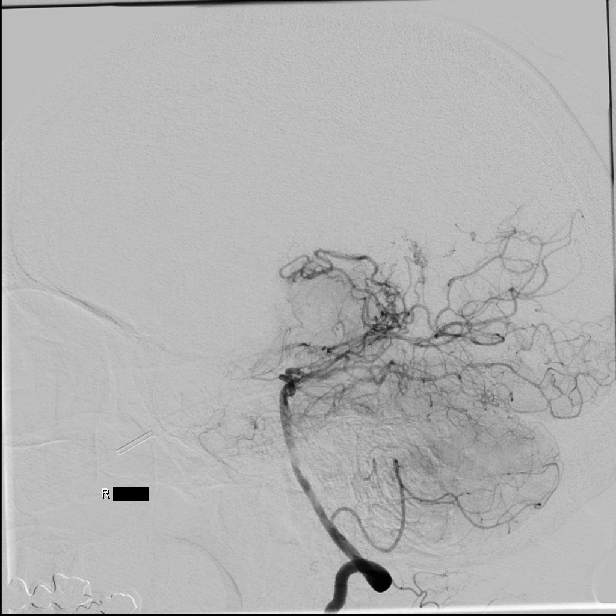
[im 109/148]
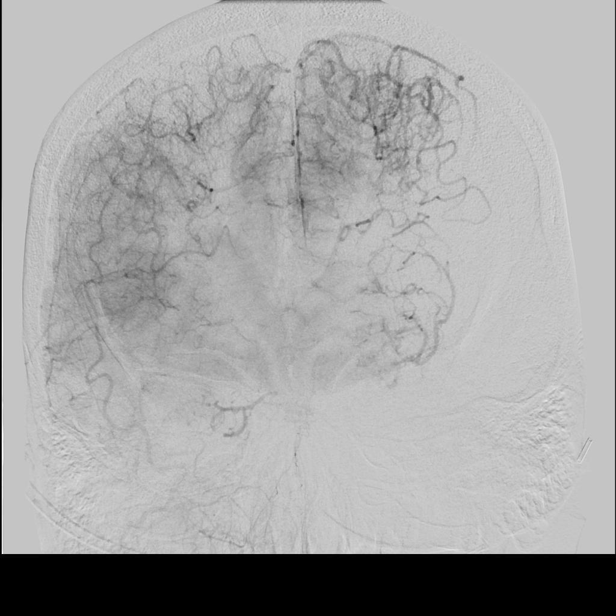
[im 122/148]
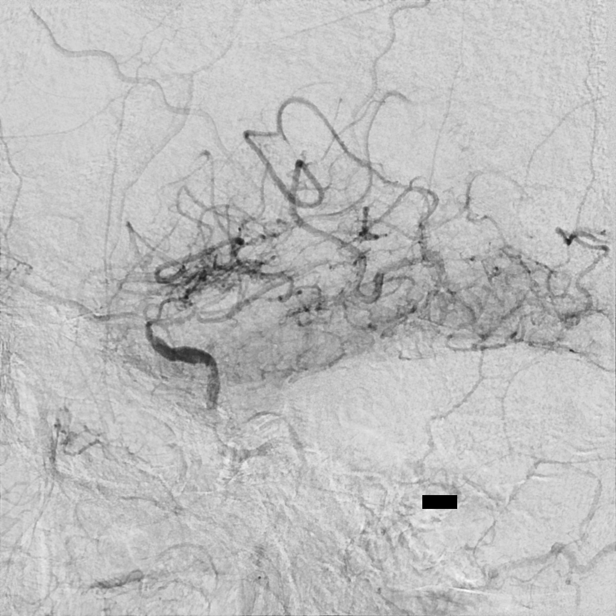
[im 135/148]
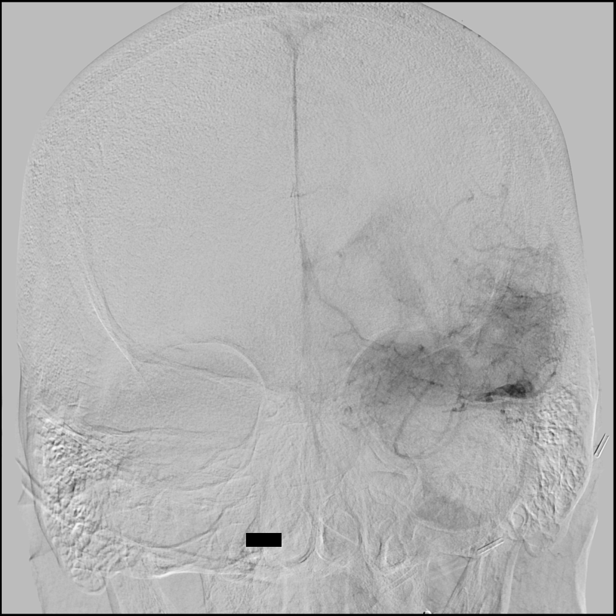
[im 148/148]
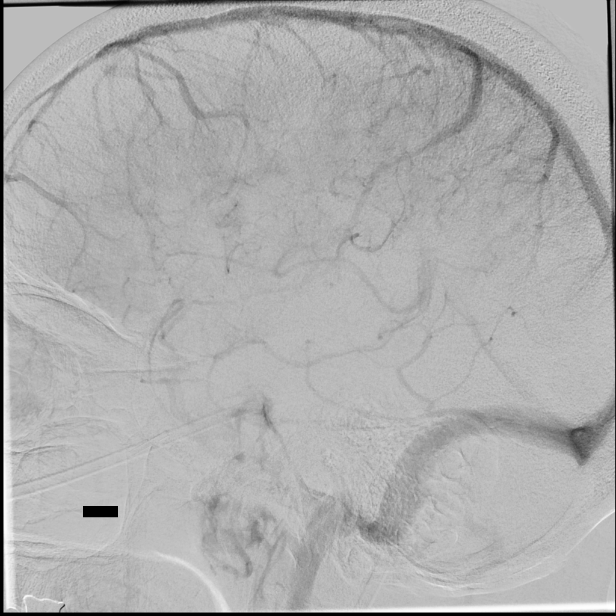

[12 of 24 positions shown; findings below may reference images not displayed]

EXAM:
BILATERAL COMMON CAROTID AND INNOMINATE ANGIOGRAPHY AND BILATERAL
VERTEBRAL ARTERY ANGIOGRAMS

PROCEDURE:
Contrast: Isovue 300 approximately 60 mL

Anesthesia/Sedation:  Conscious sedation.

Medications: Versed 0.5 mg IV. Fentanyl 12.5 mcg IV. Heparin 3555
units IV.

Following a full explanation of the procedure along with the
potential associated complications, an informed witnessed consent
was obtained.

The right groin was prepped and draped in the usual sterile fashion.
Thereafter using modified Seldinger technique, transfemoral access
into the right common femoral artery was obtained without
difficulty. Over a 0.035 inch guidewire, a 5 French Pinnacle sheath
was inserted. Through this, and also over 0.035 inch guidewire, a 5
French JB 1 catheter was advanced to the aortic arch region and
selectively positioned in the right common carotid artery, the right
vertebral artery, the left common carotid artery and the left
vertebral artery.

There were no acute complications. The patient tolerated the
procedure well.
FINDINGS: The right common carotid arteriogram demonstrates the right external
carotid artery to be mildly narrowed at its origin. Its branches are
normally opacified.

The right internal carotid artery at the bulb demonstrates a
narrowing of approximately 40-50% just distal to the bulb.

More distally the vessel is seen to opacify normally to the cranial
skull base.

There is mild narrowing of the right internal carotid artery at the
petrous cavernous junction.

The caval segment of the right internal carotid artery demonstrates
severe focal stenoses of approximately 80-90%. Distal to this the
supraclinoid segment appears widely patent.

A focal outpouching is seen from the right petrous cavernous segment
seen on the AP projection.

The right middle cerebral artery demonstrates mild narrowing at its
proximal aspect.

Its branches, otherwise, are normally opacified. The right anterior
cerebral artery is seen to opacify into the capillary and venous
phases.

Also demonstrated is prompt opacification via the anterior
communicating artery of the left anterior cerebral artery A2 segment
and distally.

There is nonvisualization of the left anterior cerebral artery A1
segment.

Multiple collaterals are seen in the region of the left anterior
cerebral artery proximal A1 segment.

The delayed arterial phase in the AP projection demonstrates a
retrograde opacification of the left middle cerebral artery
distribution to the level of the Baraa triangle on the left.

The right vertebral artery origin is normal.

The vessel is seen to opacify normally to the cranial skull base.

Normal opacification is seen in the right posterior-inferior
cerebellar artery and the vertebrobasilar junction.

The basilar artery, the superior cerebellar arteries and the and the
anterior-inferior cerebellar arteries are seen to opacify normally
into the capillary and venous phases. There is moderate stenosis of
the right posterior cerebral artery P1 segment and also of the left
posterior cerebral artery P1 P2 segment.

Extensive collaterals are seen from the lateral thalamus perforators
retrogradely opacifying the superior posterior thalamic region.

Also demonstrated are leptomeningeal collaterals opacifying the left
posterior parietal regions.

The left vertebral artery origin is normal.

The vessel is seen to opacify normally to the cranial skull base.
Normal opacification is seen of the left posterior-inferior
cerebellar artery and the left vertebrobasilar junction.

The basilar artery, the superior cerebellar arteries and the
anterior-inferior cerebellar arteries are seen to opacify into the
capillary and venous phases. Again demonstrated is the focal
stenoses of the right posterior cerebral artery P1 segment.
Extensive thalamus perforators are seen on the left from the
proximal P1 P2 junction.

The left common carotid arteriogram demonstrates evidence of
previous endarterectomy at the level of C4-C5. There is complete
occlusion of the left internal carotid artery at the level of the
bulb associated with a stump.

Moderate atherosclerotic changes are seen involving the left common
carotid artery at the site of the proximal portion of the graft.

Patency of the left external carotid artery is seen. Intracranially
there is partial reconstitution of the petrous, the cavernous and
the supraclinoid segments which are significantly attenuated.

Extensive collaterals are seen arising from the anterior temporal
branch of the left external carotid artery, the middle meningeal
artery branches which reconstitute the left temporal lobe.

Also noted is diminutive opacification of the left middle cerebral
artery proximally.
IMPRESSION: Approximately 80-90% stenosis of the right internal carotid artery
in the caval cavernous segment.

Angiographically occluded left internal carotid artery at the bulb
site of the previous endarterectomy.

Extensive collateralization arising from the anterior temporal
branch of the left external carotid artery, the middle meningeal
branches, supplying the left temporal lobe.

Pitiful reconstitution of the left internal carotid artery in the
petrous, cavernous and supraclinoid segments.

Extensive retrograde opacification of the left middle cerebral
artery distribution from the right internal carotid artery via the
anterior communicating artery, and also from the leptomeningeal
branches, left posterior cerebral artery P3 segment, and from the
lateral thalamus perforators from the left posterior cerebral artery
P1 P2 junction.

Arteriosclerotic narrowing of the right posterior cerebral artery P1
region.

The angiographic findings were reviewed with the patient and her
close friend. The patient was asked to continue taking her
medications as prescribed, and also to maintain adequate hydration.

## 2018-03-14 NOTE — Telephone Encounter (Signed)
Signing past encounter of attempted phone call 

## 2022-08-16 ENCOUNTER — Encounter (HOSPITAL_COMMUNITY): Payer: Self-pay | Admitting: Emergency Medicine

## 2022-08-16 ENCOUNTER — Inpatient Hospital Stay (HOSPITAL_COMMUNITY): Payer: Medicare Other

## 2022-08-16 ENCOUNTER — Inpatient Hospital Stay (HOSPITAL_COMMUNITY)
Admission: EM | Admit: 2022-08-16 | Discharge: 2022-08-21 | DRG: 637 | Disposition: A | Discharge status: Skilled Nursing Facility | Payer: Medicare Other | Attending: Family Medicine | Admitting: Family Medicine

## 2022-08-16 ENCOUNTER — Emergency Department (HOSPITAL_COMMUNITY): Payer: Medicare Other

## 2022-08-16 ENCOUNTER — Other Ambulatory Visit: Payer: Self-pay

## 2022-08-16 DIAGNOSIS — F419 Anxiety disorder, unspecified: Secondary | ICD-10-CM | POA: Diagnosis present

## 2022-08-16 DIAGNOSIS — R768 Other specified abnormal immunological findings in serum: Secondary | ICD-10-CM | POA: Insufficient documentation

## 2022-08-16 DIAGNOSIS — Z83438 Family history of other disorder of lipoprotein metabolism and other lipidemia: Secondary | ICD-10-CM | POA: Diagnosis not present

## 2022-08-16 DIAGNOSIS — N289 Disorder of kidney and ureter, unspecified: Secondary | ICD-10-CM | POA: Diagnosis not present

## 2022-08-16 DIAGNOSIS — Z91148 Patient's other noncompliance with medication regimen for other reason: Secondary | ICD-10-CM | POA: Diagnosis not present

## 2022-08-16 DIAGNOSIS — I6529 Occlusion and stenosis of unspecified carotid artery: Secondary | ICD-10-CM | POA: Diagnosis present

## 2022-08-16 DIAGNOSIS — Z7982 Long term (current) use of aspirin: Secondary | ICD-10-CM

## 2022-08-16 DIAGNOSIS — E119 Type 2 diabetes mellitus without complications: Secondary | ICD-10-CM

## 2022-08-16 DIAGNOSIS — E875 Hyperkalemia: Secondary | ICD-10-CM | POA: Diagnosis present

## 2022-08-16 DIAGNOSIS — N179 Acute kidney failure, unspecified: Secondary | ICD-10-CM | POA: Diagnosis not present

## 2022-08-16 DIAGNOSIS — E669 Obesity, unspecified: Secondary | ICD-10-CM | POA: Diagnosis present

## 2022-08-16 DIAGNOSIS — R451 Restlessness and agitation: Secondary | ICD-10-CM | POA: Diagnosis not present

## 2022-08-16 DIAGNOSIS — E785 Hyperlipidemia, unspecified: Secondary | ICD-10-CM | POA: Diagnosis present

## 2022-08-16 DIAGNOSIS — R0603 Acute respiratory distress: Secondary | ICD-10-CM | POA: Diagnosis present

## 2022-08-16 DIAGNOSIS — Z6831 Body mass index (BMI) 31.0-31.9, adult: Secondary | ICD-10-CM | POA: Diagnosis not present

## 2022-08-16 DIAGNOSIS — Z8249 Family history of ischemic heart disease and other diseases of the circulatory system: Secondary | ICD-10-CM

## 2022-08-16 DIAGNOSIS — I10 Essential (primary) hypertension: Secondary | ICD-10-CM | POA: Diagnosis present

## 2022-08-16 DIAGNOSIS — R7989 Other specified abnormal findings of blood chemistry: Secondary | ICD-10-CM | POA: Insufficient documentation

## 2022-08-16 DIAGNOSIS — Z7951 Long term (current) use of inhaled steroids: Secondary | ICD-10-CM

## 2022-08-16 DIAGNOSIS — Z885 Allergy status to narcotic agent status: Secondary | ICD-10-CM

## 2022-08-16 DIAGNOSIS — E1110 Type 2 diabetes mellitus with ketoacidosis without coma: Principal | ICD-10-CM | POA: Diagnosis present

## 2022-08-16 DIAGNOSIS — D649 Anemia, unspecified: Secondary | ICD-10-CM | POA: Diagnosis present

## 2022-08-16 DIAGNOSIS — T380X5A Adverse effect of glucocorticoids and synthetic analogues, initial encounter: Secondary | ICD-10-CM | POA: Diagnosis present

## 2022-08-16 DIAGNOSIS — E1122 Type 2 diabetes mellitus with diabetic chronic kidney disease: Secondary | ICD-10-CM

## 2022-08-16 DIAGNOSIS — E876 Hypokalemia: Secondary | ICD-10-CM | POA: Diagnosis present

## 2022-08-16 DIAGNOSIS — U071 COVID-19: Secondary | ICD-10-CM | POA: Diagnosis present

## 2022-08-16 DIAGNOSIS — J441 Chronic obstructive pulmonary disease with (acute) exacerbation: Secondary | ICD-10-CM | POA: Diagnosis present

## 2022-08-16 DIAGNOSIS — Z79899 Other long term (current) drug therapy: Secondary | ICD-10-CM | POA: Diagnosis not present

## 2022-08-16 DIAGNOSIS — Z8673 Personal history of transient ischemic attack (TIA), and cerebral infarction without residual deficits: Secondary | ICD-10-CM

## 2022-08-16 DIAGNOSIS — Z7902 Long term (current) use of antithrombotics/antiplatelets: Secondary | ICD-10-CM

## 2022-08-16 DIAGNOSIS — F17210 Nicotine dependence, cigarettes, uncomplicated: Secondary | ICD-10-CM | POA: Diagnosis present

## 2022-08-16 DIAGNOSIS — E0810 Diabetes mellitus due to underlying condition with ketoacidosis without coma: Secondary | ICD-10-CM | POA: Diagnosis not present

## 2022-08-16 LAB — LACTIC ACID, PLASMA
Lactic Acid, Venous: 4.8 mmol/L (ref 0.5–1.9)
Lactic Acid, Venous: 5.1 mmol/L (ref 0.5–1.9)
Lactic Acid, Venous: 2.1 mmol/L (ref 0.5–1.9)
Lactic Acid, Venous: 1.9 mmol/L (ref 0.5–1.9)

## 2022-08-16 LAB — URINALYSIS, ROUTINE W REFLEX MICROSCOPIC
Bilirubin Urine: NEGATIVE
Glucose, UA: >=500 mg/dL — AB
Ketones, ur: 80 mg/dL — AB
Leukocytes,Ua: NEGATIVE
Nitrite: NEGATIVE
Protein, ur: 30 mg/dL — AB
Specific Gravity, Urine: 1.017 (ref 1.005–1.030)
pH: 5 (ref 5.0–8.0)

## 2022-08-16 LAB — C-REACTIVE PROTEIN: CRP: 1.8 mg/dL — ABNORMAL HIGH (ref <1.0)

## 2022-08-16 LAB — CBC WITH DIFFERENTIAL/PLATELET
Abs Immature Granulocytes: 0 10*3/uL (ref 0.00–0.07)
Basophils Absolute: 0 10*3/uL (ref 0.0–0.1)
Basophils Relative: 0 %
Eosinophils Absolute: 0 10*3/uL (ref 0.0–0.5)
Eosinophils Relative: 0 %
HCT: 43 % (ref 36.0–46.0)
Hemoglobin: 13.6 g/dL (ref 12.0–15.0)
Lymphocytes Relative: 5 %
Lymphs Abs: 0.7 10*3/uL (ref 0.7–4.0)
MCH: 29.6 pg (ref 26.0–34.0)
MCHC: 31.6 g/dL (ref 30.0–36.0)
MCV: 93.5 fL (ref 80.0–100.0)
Monocytes Absolute: 1.3 10*3/uL — ABNORMAL HIGH (ref 0.1–1.0)
Monocytes Relative: 10 %
Neutro Abs: 11.1 10*3/uL — ABNORMAL HIGH (ref 1.7–7.7)
Neutrophils Relative %: 85 %
Platelets: 278 10*3/uL (ref 150–400)
RBC: 4.6 MIL/uL (ref 3.87–5.11)
RDW: 14.4 % (ref 11.5–15.5)
WBC: 13.1 10*3/uL — ABNORMAL HIGH (ref 4.0–10.5)
nRBC: 0.2 % (ref 0.0–0.2)

## 2022-08-16 LAB — PROCALCITONIN: Procalcitonin: 0.36 ng/mL

## 2022-08-16 LAB — BLOOD GAS, VENOUS
Acid-base deficit: 20.9 mmol/L — ABNORMAL HIGH (ref 0.0–2.0)
Bicarbonate: 7.1 mmol/L — ABNORMAL LOW (ref 20.0–28.0)
Drawn by: 53112
O2 Saturation: 43.6 %
Patient temperature: 36.5
pCO2, Ven: 23 mmHg — ABNORMAL LOW (ref 44–60)
pH, Ven: 7.11 — CL (ref 7.25–7.43)
pO2, Ven: 32 mmHg (ref 32–45)

## 2022-08-16 LAB — CBG MONITORING, ED
Glucose-Capillary: 584 mg/dL (ref 70–99)
Glucose-Capillary: 547 mg/dL (ref 70–99)
Glucose-Capillary: 400 mg/dL — ABNORMAL HIGH (ref 70–99)
Glucose-Capillary: 350 mg/dL — ABNORMAL HIGH (ref 70–99)
Glucose-Capillary: 290 mg/dL — ABNORMAL HIGH (ref 70–99)

## 2022-08-16 LAB — PROTIME-INR
INR: 1.1 (ref 0.8–1.2)
Prothrombin Time: 13.9 seconds (ref 11.4–15.2)

## 2022-08-16 LAB — RESP PANEL BY RT-PCR (RSV, FLU A&B, COVID)  RVPGX2
Influenza A by PCR: NEGATIVE
Influenza B by PCR: NEGATIVE
Resp Syncytial Virus by PCR: NEGATIVE
SARS Coronavirus 2 by RT PCR: POSITIVE — AB

## 2022-08-16 LAB — BASIC METABOLIC PANEL
Anion gap: 18 — ABNORMAL HIGH (ref 5–15)
Anion gap: 13 (ref 5–15)
BUN: 48 mg/dL — ABNORMAL HIGH (ref 8–23)
BUN: 45 mg/dL — ABNORMAL HIGH (ref 8–23)
BUN: 41 mg/dL — ABNORMAL HIGH (ref 8–23)
CO2: <7 mmol/L — ABNORMAL LOW (ref 22–32)
CO2: 10 mmol/L — ABNORMAL LOW (ref 22–32)
CO2: 15 mmol/L — ABNORMAL LOW (ref 22–32)
Calcium: 8.2 mg/dL — ABNORMAL LOW (ref 8.9–10.3)
Calcium: 8.2 mg/dL — ABNORMAL LOW (ref 8.9–10.3)
Calcium: 8.5 mg/dL — ABNORMAL LOW (ref 8.9–10.3)
Chloride: 106 mmol/L (ref 98–111)
Chloride: 108 mmol/L (ref 98–111)
Chloride: 110 mmol/L (ref 98–111)
Creatinine, Ser: 2.04 mg/dL — ABNORMAL HIGH (ref 0.44–1.00)
Creatinine, Ser: 1.76 mg/dL — ABNORMAL HIGH (ref 0.44–1.00)
Creatinine, Ser: 1.42 mg/dL — ABNORMAL HIGH (ref 0.44–1.00)
GFR, Estimated: 26 mL/min — ABNORMAL LOW (ref >60)
GFR, Estimated: 31 mL/min — ABNORMAL LOW (ref >60)
GFR, Estimated: 40 mL/min — ABNORMAL LOW (ref >60)
Glucose, Bld: 388 mg/dL — ABNORMAL HIGH (ref 70–99)
Glucose, Bld: 279 mg/dL — ABNORMAL HIGH (ref 70–99)
Glucose, Bld: 246 mg/dL — ABNORMAL HIGH (ref 70–99)
Potassium: 4.9 mmol/L (ref 3.5–5.1)
Potassium: 4.4 mmol/L (ref 3.5–5.1)
Potassium: 4.5 mmol/L (ref 3.5–5.1)
Sodium: 134 mmol/L — ABNORMAL LOW (ref 135–145)
Sodium: 136 mmol/L (ref 135–145)
Sodium: 138 mmol/L (ref 135–145)

## 2022-08-16 LAB — BLOOD GAS, ARTERIAL
Acid-base deficit: 21.2 mmol/L — ABNORMAL HIGH (ref 0.0–2.0)
Acid-base deficit: 12.5 mmol/L — ABNORMAL HIGH (ref 0.0–2.0)
Bicarbonate: 5.1 mmol/L — ABNORMAL LOW (ref 20.0–28.0)
Bicarbonate: 12.3 mmol/L — ABNORMAL LOW (ref 20.0–28.0)
Drawn by: 41977
Drawn by: 38235
FIO2: 21 %
FIO2: 40 %
O2 Saturation: 99.1 %
O2 Saturation: 99.9 %
Patient temperature: 36.5
Patient temperature: 37
pCO2 arterial: <18 mmHg — CL (ref 32–48)
pCO2 arterial: 25 mmHg — ABNORMAL LOW (ref 32–48)
pH, Arterial: 7.18 — CL (ref 7.35–7.45)
pH, Arterial: 7.3 — ABNORMAL LOW (ref 7.35–7.45)
pO2, Arterial: 105 mmHg (ref 83–108)
pO2, Arterial: 170 mmHg — ABNORMAL HIGH (ref 83–108)

## 2022-08-16 LAB — HIV ANTIBODY (ROUTINE TESTING W REFLEX): HIV Screen 4th Generation wRfx: NONREACTIVE

## 2022-08-16 LAB — GLUCOSE, CAPILLARY
Glucose-Capillary: 227 mg/dL — ABNORMAL HIGH (ref 70–99)
Glucose-Capillary: 235 mg/dL — ABNORMAL HIGH (ref 70–99)
Glucose-Capillary: 235 mg/dL — ABNORMAL HIGH (ref 70–99)
Glucose-Capillary: 238 mg/dL — ABNORMAL HIGH (ref 70–99)
Glucose-Capillary: 239 mg/dL — ABNORMAL HIGH (ref 70–99)
Glucose-Capillary: 242 mg/dL — ABNORMAL HIGH (ref 70–99)

## 2022-08-16 LAB — TROPONIN I (HIGH SENSITIVITY)
Troponin I (High Sensitivity): 13 ng/L (ref <18)
Troponin I (High Sensitivity): 12 ng/L (ref <18)

## 2022-08-16 LAB — SEDIMENTATION RATE: Sed Rate: 48 mm/hr — ABNORMAL HIGH (ref 0–22)

## 2022-08-16 LAB — MRSA NEXT GEN BY PCR, NASAL: MRSA by PCR Next Gen: NOT DETECTED

## 2022-08-16 LAB — APTT: aPTT: 22 seconds — ABNORMAL LOW (ref 24–36)

## 2022-08-16 LAB — PHOSPHORUS: Phosphorus: 5.1 mg/dL — ABNORMAL HIGH (ref 2.5–4.6)

## 2022-08-16 LAB — MAGNESIUM: Magnesium: 2.4 mg/dL (ref 1.7–2.4)

## 2022-08-16 LAB — BETA-HYDROXYBUTYRIC ACID
Beta-Hydroxybutyric Acid: >8 mmol/L — ABNORMAL HIGH (ref 0.05–0.27)
Beta-Hydroxybutyric Acid: 4.11 mmol/L — ABNORMAL HIGH (ref 0.05–0.27)

## 2022-08-16 MED ORDER — LACTATED RINGERS IV SOLN: INTRAVENOUS | Status: DC

## 2022-08-16 MED ORDER — NIRMATRELVIR/RITONAVIR (PAXLOVID)TABLET: 3.0000 | ORAL_TABLET | Freq: Two times a day (BID) | ORAL | Status: DC

## 2022-08-16 MED ORDER — SODIUM CHLORIDE 0.9 % IV SOLN: INTRAVENOUS | Status: DC

## 2022-08-16 MED ORDER — ATORVASTATIN CALCIUM 40 MG PO TABS
40.0000 mg | ORAL_TABLET | Freq: Every day | ORAL | Status: DC
  Administered 2022-08-18 – 2022-08-21 (×4): 40 mg via ORAL
  Filled 2022-08-16 (×4): qty 1

## 2022-08-16 MED ORDER — TIOTROPIUM BROMIDE MONOHYDRATE 18 MCG IN CAPS: 18.0000 ug | ORAL_CAPSULE | Freq: Every day | RESPIRATORY_TRACT | Status: DC

## 2022-08-16 MED ORDER — DEXTROSE IN LACTATED RINGERS 5 % IV SOLN: INTRAVENOUS | Status: DC

## 2022-08-16 MED ORDER — LEVALBUTEROL HCL 0.63 MG/3ML IN NEBU
0.6300 mg | INHALATION_SOLUTION | Freq: Four times a day (QID) | RESPIRATORY_TRACT | Status: DC
  Administered 2022-08-16 – 2022-08-17 (×3): 0.63 mg via RESPIRATORY_TRACT
  Filled 2022-08-16 (×3): qty 3

## 2022-08-16 MED ORDER — CLOPIDOGREL BISULFATE 75 MG PO TABS
75.0000 mg | ORAL_TABLET | Freq: Every day | ORAL | Status: DC
  Administered 2022-08-18 – 2022-08-21 (×4): 75 mg via ORAL
  Filled 2022-08-16 (×4): qty 1

## 2022-08-16 MED ORDER — ACETAMINOPHEN 650 MG RE SUPP: 650.0000 mg | Freq: Four times a day (QID) | RECTAL | Status: DC | PRN

## 2022-08-16 MED ORDER — IPRATROPIUM BROMIDE 0.02 % IN SOLN
0.5000 mg | Freq: Four times a day (QID) | RESPIRATORY_TRACT | Status: DC
  Administered 2022-08-16: 0.5 mg via RESPIRATORY_TRACT
  Filled 2022-08-16: qty 2.5

## 2022-08-16 MED ORDER — SODIUM CHLORIDE 0.9 % IV SOLN
2.0000 g | Freq: Once | INTRAVENOUS | Status: AC
  Administered 2022-08-16: 2 g via INTRAVENOUS
  Filled 2022-08-16: qty 12.5

## 2022-08-16 MED ORDER — BUDESONIDE 0.5 MG/2ML IN SUSP
0.5000 mg | Freq: Two times a day (BID) | RESPIRATORY_TRACT | Status: DC
  Administered 2022-08-16 – 2022-08-21 (×10): 0.5 mg via RESPIRATORY_TRACT
  Filled 2022-08-16 (×10): qty 2

## 2022-08-16 MED ORDER — ZINC SULFATE 220 (50 ZN) MG PO CAPS
220.0000 mg | ORAL_CAPSULE | Freq: Every day | ORAL | Status: DC
  Administered 2022-08-18 – 2022-08-21 (×4): 220 mg via ORAL
  Filled 2022-08-16 (×4): qty 1

## 2022-08-16 MED ORDER — SODIUM CHLORIDE 0.9 % IV SOLN
2.0000 g | INTRAVENOUS | Status: DC
  Administered 2022-08-17: 2 g via INTRAVENOUS
  Filled 2022-08-16: qty 12.5

## 2022-08-16 MED ORDER — SODIUM CHLORIDE 0.9% FLUSH
3.0000 mL | Freq: Two times a day (BID) | INTRAVENOUS | Status: DC
  Administered 2022-08-16 – 2022-08-21 (×7): 3 mL via INTRAVENOUS

## 2022-08-16 MED ORDER — FLUTICASONE FUROATE-VILANTEROL 200-25 MCG/ACT IN AEPB: 1.0000 | INHALATION_SPRAY | Freq: Every day | RESPIRATORY_TRACT | Status: DC

## 2022-08-16 MED ORDER — BISACODYL 5 MG PO TBEC: 5.0000 mg | DELAYED_RELEASE_TABLET | Freq: Every day | ORAL | Status: DC | PRN

## 2022-08-16 MED ORDER — ONDANSETRON HCL 4 MG PO TABS: 4.0000 mg | ORAL_TABLET | Freq: Four times a day (QID) | ORAL | Status: DC | PRN

## 2022-08-16 MED ORDER — SODIUM CHLORIDE 0.9% FLUSH
3.0000 mL | Freq: Two times a day (BID) | INTRAVENOUS | Status: DC
  Administered 2022-08-16 – 2022-08-21 (×10): 3 mL via INTRAVENOUS

## 2022-08-16 MED ORDER — HYDROMORPHONE HCL 1 MG/ML IJ SOLN: 0.5000 mg | INTRAMUSCULAR | Status: DC | PRN

## 2022-08-16 MED ORDER — FLEET ENEMA 7-19 GM/118ML RE ENEM: 1.0000 | ENEMA | Freq: Once | RECTAL | Status: DC | PRN

## 2022-08-16 MED ORDER — MOLNUPIRAVIR EUA 200MG CAPSULE
4.0000 | ORAL_CAPSULE | Freq: Two times a day (BID) | ORAL | Status: DC
  Administered 2022-08-17 – 2022-08-18 (×2): 800 mg via ORAL
  Filled 2022-08-16: qty 4

## 2022-08-16 MED ORDER — CHLORHEXIDINE GLUCONATE CLOTH 2 % EX PADS
6.0000 | MEDICATED_PAD | Freq: Every day | CUTANEOUS | Status: DC
  Administered 2022-08-16 – 2022-08-19 (×4): 6 via TOPICAL

## 2022-08-16 MED ORDER — OXYCODONE HCL 5 MG PO TABS: 5.0000 mg | ORAL_TABLET | ORAL | Status: DC | PRN

## 2022-08-16 MED ORDER — SODIUM BICARBONATE 8.4 % IV SOLN
50.0000 meq | Freq: Once | INTRAVENOUS | Status: AC
  Administered 2022-08-16: 50 meq via INTRAVENOUS
  Filled 2022-08-16: qty 50

## 2022-08-16 MED ORDER — TRAZODONE HCL 50 MG PO TABS
25.0000 mg | ORAL_TABLET | Freq: Every evening | ORAL | Status: DC | PRN
  Administered 2022-08-17 – 2022-08-20 (×2): 25 mg via ORAL
  Filled 2022-08-16 (×2): qty 1

## 2022-08-16 MED ORDER — HYDRALAZINE HCL 20 MG/ML IJ SOLN
10.0000 mg | INTRAMUSCULAR | Status: DC | PRN
  Administered 2022-08-16 – 2022-08-17 (×2): 10 mg via INTRAVENOUS
  Filled 2022-08-16 (×2): qty 1

## 2022-08-16 MED ORDER — LEVALBUTEROL TARTRATE 45 MCG/ACT IN AERO
1.0000 | INHALATION_SPRAY | Freq: Four times a day (QID) | RESPIRATORY_TRACT | Status: DC
  Filled 2022-08-16: qty 15

## 2022-08-16 MED ORDER — UMECLIDINIUM BROMIDE 62.5 MCG/ACT IN AEPB: 1.0000 | INHALATION_SPRAY | Freq: Every day | RESPIRATORY_TRACT | Status: DC

## 2022-08-16 MED ORDER — ONDANSETRON HCL 4 MG/2ML IJ SOLN: 4.0000 mg | Freq: Four times a day (QID) | INTRAMUSCULAR | Status: DC | PRN

## 2022-08-16 MED ORDER — VANCOMYCIN HCL IN DEXTROSE 1-5 GM/200ML-% IV SOLN
1000.0000 mg | Freq: Once | INTRAVENOUS | Status: AC
  Administered 2022-08-16: 1000 mg via INTRAVENOUS
  Filled 2022-08-16: qty 200

## 2022-08-16 MED ORDER — INSULIN REGULAR(HUMAN) IN NACL 100-0.9 UT/100ML-% IV SOLN
INTRAVENOUS | Status: DC
  Filled 2022-08-16: qty 100

## 2022-08-16 MED ORDER — LACTATED RINGERS IV BOLUS: 20.0000 mL/kg | Freq: Once | INTRAVENOUS | Status: DC

## 2022-08-16 MED ORDER — ACETAMINOPHEN 325 MG PO TABS: 650.0000 mg | ORAL_TABLET | Freq: Four times a day (QID) | ORAL | Status: DC | PRN

## 2022-08-16 MED ORDER — REVEFENACIN 175 MCG/3ML IN SOLN
175.0000 ug | Freq: Every day | RESPIRATORY_TRACT | Status: DC
  Administered 2022-08-16 – 2022-08-21 (×6): 175 ug via RESPIRATORY_TRACT
  Filled 2022-08-16 (×8): qty 3

## 2022-08-16 MED ORDER — SODIUM CHLORIDE 0.9 % IV BOLUS
2000.0000 mL | Freq: Once | INTRAVENOUS | Status: AC
  Administered 2022-08-16: 2000 mL via INTRAVENOUS

## 2022-08-16 MED ORDER — ALPRAZOLAM 1 MG PO TABS
1.0000 mg | ORAL_TABLET | Freq: Three times a day (TID) | ORAL | Status: DC | PRN
  Administered 2022-08-17 – 2022-08-21 (×9): 1 mg via ORAL
  Filled 2022-08-16 (×3): qty 2
  Filled 2022-08-16 (×2): qty 1
  Filled 2022-08-16: qty 2
  Filled 2022-08-16 (×3): qty 1

## 2022-08-16 MED ORDER — VANCOMYCIN HCL IN DEXTROSE 1-5 GM/200ML-% IV SOLN: 1000.0000 mg | INTRAVENOUS | Status: DC

## 2022-08-16 MED ORDER — HEPARIN SODIUM (PORCINE) 5000 UNIT/ML IJ SOLN
5000.0000 [IU] | Freq: Three times a day (TID) | INTRAMUSCULAR | Status: DC
  Administered 2022-08-16 – 2022-08-21 (×14): 5000 [IU] via SUBCUTANEOUS
  Filled 2022-08-16 (×14): qty 1

## 2022-08-16 MED ORDER — INSULIN ASPART 100 UNIT/ML IV SOLN
10.0000 [IU] | Freq: Once | INTRAVENOUS | Status: AC
  Administered 2022-08-16: 10 [IU] via INTRAVENOUS

## 2022-08-16 MED ORDER — SODIUM CHLORIDE 0.9% FLUSH: 3.0000 mL | INTRAVENOUS | Status: DC | PRN

## 2022-08-16 MED ORDER — INSULIN REGULAR(HUMAN) IN NACL 100-0.9 UT/100ML-% IV SOLN
INTRAVENOUS | Status: DC
  Administered 2022-08-16: 10.5 [IU]/h via INTRAVENOUS
  Filled 2022-08-16: qty 100

## 2022-08-16 MED ORDER — METHYLPREDNISOLONE SODIUM SUCC 40 MG IJ SOLR
40.0000 mg | Freq: Once | INTRAMUSCULAR | Status: AC
  Administered 2022-08-16: 40 mg via INTRAVENOUS
  Filled 2022-08-16: qty 1

## 2022-08-16 MED ORDER — DEXTROSE 50 % IV SOLN: 0.0000 mL | INTRAVENOUS | Status: DC | PRN

## 2022-08-16 MED ORDER — ALBUTEROL SULFATE (2.5 MG/3ML) 0.083% IN NEBU
2.5000 mg | INHALATION_SOLUTION | RESPIRATORY_TRACT | Status: DC | PRN
  Filled 2022-08-16: qty 3

## 2022-08-16 MED ORDER — ALBUTEROL SULFATE HFA 108 (90 BASE) MCG/ACT IN AERS
2.0000 | INHALATION_SPRAY | RESPIRATORY_TRACT | Status: DC | PRN
  Administered 2022-08-16: 2 via RESPIRATORY_TRACT
  Filled 2022-08-16: qty 6.7

## 2022-08-16 MED ORDER — IPRATROPIUM BROMIDE 0.02 % IN SOLN: 0.5000 mg | Freq: Four times a day (QID) | RESPIRATORY_TRACT | Status: DC | PRN

## 2022-08-16 MED ORDER — CALCIUM GLUCONATE 10 % IV SOLN
1.0000 g | Freq: Once | INTRAVENOUS | Status: AC
  Administered 2022-08-16: 1 g via INTRAVENOUS
  Filled 2022-08-16: qty 10

## 2022-08-16 MED ORDER — SODIUM CHLORIDE 0.9 % IV SOLN: 250.0000 mL | INTRAVENOUS | Status: DC | PRN

## 2022-08-16 MED ORDER — VITAMIN C 500 MG PO TABS
500.0000 mg | ORAL_TABLET | Freq: Every day | ORAL | Status: DC
  Administered 2022-08-18 – 2022-08-21 (×4): 500 mg via ORAL
  Filled 2022-08-16 (×4): qty 1

## 2022-08-16 MED ORDER — LEVALBUTEROL HCL 0.63 MG/3ML IN NEBU: 0.6300 mg | INHALATION_SOLUTION | Freq: Four times a day (QID) | RESPIRATORY_TRACT | Status: DC | PRN

## 2022-08-16 MED ORDER — LABETALOL HCL 5 MG/ML IV SOLN: 5.0000 mg | Freq: Four times a day (QID) | INTRAVENOUS | Status: DC | PRN

## 2022-08-16 MED ORDER — SODIUM BICARBONATE 8.4 % IV SOLN: 50.0000 meq | Freq: Once | INTRAVENOUS | Status: DC

## 2022-08-16 MED ORDER — SENNOSIDES-DOCUSATE SODIUM 8.6-50 MG PO TABS: 1.0000 | ORAL_TABLET | Freq: Every evening | ORAL | Status: DC | PRN

## 2022-08-16 MED ORDER — SODIUM CHLORIDE 0.9 % IV BOLUS
1000.0000 mL | Freq: Once | INTRAVENOUS | Status: AC
  Administered 2022-08-16: 1000 mL via INTRAVENOUS

## 2022-08-16 MED ORDER — LEVALBUTEROL HCL 0.63 MG/3ML IN NEBU
0.6300 mg | INHALATION_SOLUTION | Freq: Four times a day (QID) | RESPIRATORY_TRACT | Status: DC
  Administered 2022-08-16: 0.63 mg via RESPIRATORY_TRACT
  Filled 2022-08-16: qty 3

## 2022-08-16 MED ORDER — METOPROLOL TARTRATE 25 MG PO TABS
25.0000 mg | ORAL_TABLET | Freq: Two times a day (BID) | ORAL | Status: DC
  Administered 2022-08-17: 25 mg via ORAL
  Filled 2022-08-16 (×2): qty 1

## 2022-08-16 MED ORDER — BUDESONIDE 0.25 MG/2ML IN SUSP: 0.2500 mg | Freq: Two times a day (BID) | RESPIRATORY_TRACT | Status: DC

## 2022-08-16 NOTE — ED Notes (Signed)
Pulmonologist at bedside

## 2022-08-16 NOTE — TOC Progression Note (Signed)
  Transition of Care [facility]) Screening Note   Patient Details  Name: Leah Wolf Date of Birth: 12-Dec-1955   Transition of Care [facility]) CM/SW Contact:    Leitha Bleak, RN Phone Number: 08/16/2022, 4:55 PM  TOC consulted for PCP, HH. Patient very sick, still in ED, going on BIPAP.  Patient has a PCP. TOC will follow to set up home health.    Transition of Care Department [facility]) has reviewed patient and no TOC needs have been identified at this time. We will continue to monitor patient advancement through interdisciplinary progression rounds. If new patient transition needs arise, please place a TOC consult.    Expected Discharge Plan: Home w Home Health Services Barriers to Discharge: Continued Medical Work up  Expected Discharge Plan and Services Expected Discharge Plan: Home w Home Health Services

## 2022-08-16 NOTE — Progress Notes (Signed)
eLink Physician-Brief Progress Note Patient Name: Leah Wolf DOB: 05-21-1956 MRN: [id number]   Date of Service  08/16/2022  HPI/Events of Note  Review of Head CT Scan w/o contrast reveals: 1. No acute intracranial abnormality. 2. Left parieto-occipital encephalomalacia and asymmetric volume loss.  eICU Interventions  Continue present management.      Intervention Category Major Interventions: Change in mental status - evaluation and management  Jennavecia Schwier Eugene 08/16/2022, 10:07 PM

## 2022-08-16 NOTE — H&P (Addendum)
History and Physical   Patient: Leah Wolf                            PCP: [name], MD                    DOB: 12-08-1955            DOA: 08/16/2022 [id number]             DOS: 08/16/2022, 3:41 PM  [name], MD  Patient coming from:   HOME  I have personally reviewed patient's medical records, in electronic medical records, including:  [place] link, and care everywhere.    Chief Complaint:   Chief Complaint  Patient presents with   Hyperglycemia   Shortness of Breath    History of present illness:    Leah Wolf this is a 66 year old female with a history of HTN, HLD, tobacco use, left carotid artery stenosis (enterotomy 04/2013 & 04/2014), anxiety, CVA, diabetes mellitus type 2 -noncompliant with oral medication  Patient is respiratory distress with audibly wheezing and rhonchi --patient's partner who she lives with present at bedside gives detail history including her recent hospitalization at [facility]  Recently diagnosed with SARS-CoV-2, was treated with high-dose steroid Decadron, and antibiotics Presented to the ED with labored breathing and severe generalized weaknesses, blood sugars > 600  ED course: Blood pressure 125/69, pulse 100, temperature 97.7 F (36.5 C), temperature source Axillary, resp. rate (!) 26, height  (1.626 m), weight 81.6 kg, SpO2 100 %.     Latest Ref Rng & Units 08/16/2022   12:35 PM 02/17/2016    8:30 AM 02/16/2016    7:38 AM  CBC  WBC 4.0 - 10.5 K/uL 13.1  8.0  6.1   Hemoglobin 12.0 - 15.0 g/dL 64.3  32.9  51.8   Hematocrit 36.0 - 46.0 % 43.0  35.2  40.5   Platelets 150 - 400 K/uL 278  222  242       Latest Ref Rng & Units 08/16/2022   12:35 PM 02/17/2016    4:52 AM 02/16/2016    7:38 AM  CMP  Glucose 70 - 99 mg/dL 841  660  630   BUN 8 - 23 mg/dL 50  5  8   Creatinine 1.60 - 1.00 mg/dL 1.09  3.23  5.57   Sodium 135 - 145 mmol/L 128  139  136   Potassium 3.5 - 5.1 mmol/L 7.2  4.6  4.2    Chloride 98 - 111 mmol/L 91  108  101   CO2 22 - 32 mmol/L Calcium 8.9 - 10.3 mg/dL 9.2  8.6  9.2   Total Protein 6.5 - 8.1 g/dL 7.3   6.5   Total Bilirubin 0.3 - 1.2 mg/dL 1.3   0.2   Alkaline Phos 38 - 126 U/L 88   43   AST 15 - 41 U/L 70   15   ALT 0 - 44 U/L 82   13    Initially patient was thought to be septic questionable pneumonia  Sepsis protocol was initiated!  With IV fluids, broad-spectrum antibiotics and cultures  -After further evaluation it was determined the patient is in Diabetic ketoacidotic state with hyperkalemia.  -Chest x-ray negative for any active disease Aggressive IV fluid resuscitation, insulin drip, sodium bicarb, and calcium gluconate was initiated in ED  Was asked patient to be admitted for DKA, and respite distress, COVID   Patient Denies having: Fever, Chills, Chest Pain, Abd pain, N/V/D, headache, dizziness, lightheadedness,  Dysuria, Joint pain, rash, open wounds     Review of Systems: As per HPI, otherwise 10 point review of systems were negative.   ----------------------------------------------------------------------------------------------------------------------  Allergies  Allergen Reactions   Percocet [Oxycodone-Acetaminophen] Anxiety   Morphine And Related Other (See Comments)    Reaction unknown    Home MEDs:  Prior to Admission medications   Medication Sig Start Date End Date Taking? Authorizing Provider  atorvastatin (LIPITOR) 40 MG tablet Take 40 mg by mouth daily. 07/19/22  Yes [provider]  azithromycin (ZITHROMAX) 600 MG tablet Take 600 mg by mouth daily. 08/14/22  Yes [provider]  cefdinir (OMNICEF) 300 MG capsule Take 300 mg by mouth 2 (two) times daily. 08/14/22  Yes [provider]  clopidogrel (PLAVIX) 75 MG tablet Take 75 mg by mouth daily.   Yes [provider]  dexamethasone (DECADRON) 4 MG tablet Take 4 mg by mouth 2 (two) times daily. 08/14/22  Yes [provider]  lisinopril-hydrochlorothiazide (ZESTORETIC) 20-12.5 MG tablet Take 1 tablet by mouth daily. 07/11/22  Yes [provider]  LORazepam (ATIVAN) 2 MG tablet Take 2 mg by mouth 4 (four) times daily.    Yes [provider]  metoprolol tartrate (LOPRESSOR) 25 MG tablet Take 25 mg by mouth 2 (two) times daily. 07/19/22  Yes [provider]  tiotropium (SPIRIVA) 18 MCG inhalation capsule Place 18 mcg into inhaler and inhale daily.   Yes [provider]  Dwyane LuoRELEGY ELLIPTA 200-62.5-25 MCG/ACT AEPB Take 1 puff by mouth daily. 08/08/22  Yes [provider]  aspirin EC 81 MG EC tablet Take 1 tablet (81 mg total) by mouth daily. Patient not taking: Reported on 08/16/2022 04/25/13   Lars Mageollins, Emma M, PA-C    PRN MEDs: sodium chloride, acetaminophen **OR** acetaminophen, albuterol, ALPRAZolam, bisacodyl, dextrose, hydrALAZINE, HYDROmorphone (DILAUDID) injection, ipratropium, ondansetron **OR** ondansetron (ZOFRAN) IV, oxyCODONE, senna-docusate, sodium chloride flush, traZODone  Past Medical History:  Diagnosis Date   Acute CVA (cerebrovascular accident) [facility](HCC) June 2014   Anxiety    Carotid artery occlusion    left   Cervical pain (neck)    Hyperlipidemia    Hypertension    Tobacco abuse    Visual aura 2011    Past Surgical History:  Procedure Laterality Date   CAROTID ENDARTERECTOMY Left Aug. 21, 2015   cea   ENDARTERECTOMY Left 04/24/2013   Procedure: ENDARTERECTOMY CAROTID with patch angioplasty;  Surgeon: Nada LibmanVance W Brabham, MD;  [facility];  Service: Vascular;  Laterality: Left;   RADIOLOGY WITH ANESTHESIA N/A 02/16/2016   Procedure: STENT PLACEMENT   (RADIOLOGY WITH ANESTHESIA);  Surgeon: Julieanne CottonSanjeev Deveshwar, MD;  [facility];  Service: Radiology;  Laterality: N/A;   TONSILLECTOMY     TUBAL LIGATION       reports that she has been smoking cigarettes. She has never used smokeless tobacco. She reports that she does not drink alcohol and  does not use drugs.   Family History  Problem Relation Age of Onset   Heart disease Mother    Heart attack Mother    Heart disease Brother        Before age 68   Hyperlipidemia Brother    Hypertension Brother    Heart attack Brother    Heart disease Brother    Hyperlipidemia Brother    Hypertension  Brother    Heart attack Brother     Physical Exam:   Vitals:   08/16/22 1345 08/16/22 1350 08/16/22 1400 08/16/22 1500  BP: 125/69  (!) 123/51 (!) 145/84  Pulse:  100 100 (!) 114  Resp: (!) 21 (!) 26 (!) 26 (!) 21  Temp:      TempSrc:      SpO2:  100% 100% 100%  Weight:   81.6 kg   Height:   5\' 4"  (1.626 m)    Constitutional: In moderate distress, with shortness of breath, wheezing unable to form full sentences Eyes: PERRL, lids and conjunctivae normal ENMT: Mucous membranes are moist. Posterior pharynx clear of any exudate or lesions.Normal dentition.  Neck: normal, supple, no masses, no thyromegaly Respiratory: Audibly wheezing, with scattered rhonchi, mildly labored breathing increased work of breathing..  Shortness of breath, otherwise satting 99% on room air Cardiovascular: Regular rate and rhythm, no murmurs / rubs / gallops. No extremity edema. 2+ pedal pulses. No carotid bruits.  Abdomen: no tenderness, no masses palpated. No hepatosplenomegaly. Bowel sounds positive.  Musculoskeletal: Severe generalized weaknesses - no clubbing / cyanosis. No joint deformity upper and lower extremities. Good ROM, no contractures. Normal muscle tone.  Neurologic: CN II-XII grossly intact. Sensation intact, DTR normal. Strength 5/5 in all 4.  Psychiatric: Normal judgment and insight. Alert and oriented x 3. Normal mood.  Skin: no rashes, lesions, ulcers. No induration Decubitus/ulcers:  Wounds: per nursing documentation         Labs on admission:    I have personally reviewed following labs and imaging studies  CBC: Recent Labs  Lab 08/16/22 1235  WBC 13.1*  NEUTROABS  11.1*  HGB 13.6  HCT 43.0  MCV 93.5  PLT 278   Basic Metabolic Panel: Recent Labs  Lab 08/16/22 1235  NA 128*  K 7.2*  CL 91*  CO2 7*  GLUCOSE 663*  BUN 50*  CREATININE 2.35*  CALCIUM 9.2   GFR: Estimated Creatinine Clearance: 23.7 mL/min (A) (by C-G formula based on SCr of 2.35 mg/dL (H)). Liver Function Tests: Recent Labs  Lab 08/16/22 1235  AST 70*  ALT 82*  ALKPHOS 88  BILITOT 1.3*  PROT 7.3  ALBUMIN 3.3*   No results for input(s): "LIPASE", "AMYLASE" in the last 168 hours. No results for input(s): "AMMONIA" in the last 168 hours. Coagulation Profile: Recent Labs  Lab 08/16/22 1241  INR 1.1    CBG: Recent Labs  Lab 08/16/22 1220 08/16/22 1452 08/16/22 1540  GLUCAP 584* 547* 400*       Component Value Date/Time   COLORURINE YELLOW 04/21/2013 1349   APPEARANCEUR HAZY (A) 04/21/2013 1349   LABSPEC 1.019 04/21/2013 1349   PHURINE 6.5 04/21/2013 1349   GLUCOSEU NEGATIVE 04/21/2013 1349   HGBUR NEGATIVE 04/21/2013 1349   BILIRUBINUR NEGATIVE 04/21/2013 1349   KETONESUR NEGATIVE 04/21/2013 1349   PROTEINUR NEGATIVE 04/21/2013 1349   UROBILINOGEN 1.0 04/21/2013 1349   NITRITE NEGATIVE 04/21/2013 1349   LEUKOCYTESUR TRACE (A) 04/21/2013 1349    Last A1C:  No results found for: "HGBA1C"   Radiologic Exams on Admission:   DG Chest Port 1 View  Result Date: 08/16/2022 CLINICAL DATA:  Shortness of breath EXAM: PORTABLE CHEST 1 VIEW COMPARISON:  04/21/2013 FINDINGS: The heart size and mediastinal contours are within normal limits. No focal airspace consolidation, pleural effusion, or pneumothorax. The visualized skeletal structures are unremarkable. IMPRESSION: No active disease. Electronically Signed   By: 04/23/2013 D.O.   On:  08/16/2022 12:44    EKG:   Independently reviewed.  Orders placed or performed during the hospital encounter of 08/16/22   ED EKG   ED EKG   ED EKG 12-Lead   ED EKG 12-Lead   EKG 12-Lead   EKG 12-Lead   EKG  12-Lead   ---------------------------------------------------------------------------------------------------------------------------------------    Assessment / Plan:   Principal Problem:   DKA (diabetic ketoacidosis) (HCC) Active Problems:   Hyperkalemia   Acute respiratory distress   HTN (hypertension)   Acute renal insufficiency   Pseudohyponatremia   DMII (diabetes mellitus, type 2) (HCC)   HLD (hyperlipidemia)   SARS-CoV-2 antibody positive   Assessment and Plan: * DKA (diabetic ketoacidosis) (HCC) -Admitting to ICU -DKA protocol initiated -Likely due to recent use of steroids Decadron as an outpatient -VBG pH 7.11, pCO2 23, pO2 32, bicarb 7.1 -Serum blood sugar 663, anion gap 30, potassium 7.2  -Aggressive IV IV fluid resuscitation, initiating insulin drip -For protocol will initiate long-acting insulin, transition fluids once anion gap is closed -Obtaining A1c -Once DKA is resolved, will resume with long-acting insulin, checking CBG q. ACHS with SSI coverage -Monitoring acidosis, continue aggressive IV fluids, checking BMP every 4 hours  Acute respiratory distress Acute on chronic due to SARS-CoV-2 infection -Remained tachypneic -which is exacerbated by acidotic state -Fortunately she is satting 99% on room air -She is audibly wheezing, with rhonchi -Will abstain from IV steroid use at this time, continue breathing treatments -Will treat underlying cause of DKA -Resuming scheduled and as needed bronchodilator treatment -Chest x-ray reviewed, clear -Discussed the case with PCCM Dr. Vassie Loll--- appreciate his input and assessed    Hyperkalemia - Currently's potassium level is at 7.2 -EKG peaked T waves-in ED patient has been initiated on calcium gluconate, bicarb drip -Initiating aggressive IV fluid resuscitation with insulin drip Anticipating steady decline in potassium level, checking and monitoring potassium level very close  DMII (diabetes mellitus, type 2)  (HCC) - Noncompliant diabetic type II (her partner has not taken her medication) -Likely developed into diabetic ketoacidosis due to recent high steroid use, Decadron for COVID treatment -Will treat diabetic ketoacidosis aggressively -Once improved, tolerating p.o., anticipating initiating long-acting insulin, and oral medications -Checking hemoglobin A1c  Pseudohyponatremia - Currently sodium is at 128, in the face of diabetic ketoacidosis with blood sugar of 663 -Anticipating rapid improvement of serum sodium level -with aggressive IV fluid resuscitation -Monitoring closely, BMP every 4 hours  Acute renal insufficiency Lab Results  Component Value Date   CREATININE 2.35 (H) 08/16/2022   CREATININE 0.62 02/17/2016   CREATININE 0.79 02/16/2016   -BUN 50, creatinine 2.35 -Acute renal sufficiency likely due to dehydration, diabetic ketoacidosis -Continue with IV fluid resuscitation, -Avoiding nephrotoxins -We will holding BP medication of HCTZ  HTN (hypertension) - Currently stable -Once tolerating p.o., resuming home medication of metoprolol,  -We will holding home medication of HCTZ/lisinopril -due to electrolyte abnormalities and AKI -As needed IV hydralazine  SARS-CoV-2 antibody positive - Per patient was recently diagnosed with SARS-CoV-2 as an outpatient -Inpatient screening SARS-CoV-2 positive -Negative influenza A/B -As an outpatient was treated with oral antibiotics, steroids Decadron -Steroids may have tipped the patient over hyperglycemia, diabetic ketoacidotic state -Will initiate Paxlovid  -Currently stable, satting 100% on room air -Chest x-ray clear  - continue supportive care   HLD (hyperlipidemia) -Continue statins  Carotid stenosis - History of carotid stenosis -continue home medication of statin and Plavix     Consults called:   None -------------------------------------------------------------------------------------------------------------------------------------------- DVT prophylaxis:  heparin injection 5,000 Units Start: 08/16/22 1415 Place TED hose Start: 08/16/22 1412 TED hose Start: 08/16/22 1411 SCDs Start: 08/16/22 1407   Code Status:   Code Status: Full Code   Admission status: Patient will be admitted as Inpatient, with a greater than 2 midnight length of stay. Level of care: ICU   Family Communication:  none at bedside  (The above findings and plan of care has been discussed with patient in detail, the patient expressed understanding and agreement of above plan)  --------------------------------------------------------------------------------------------------------------------------------------------------  Disposition Plan:  Anticipated 1-2 days Status is: Inpatient Remains inpatient appropriate because: Needing aggressive treatment for diabetic ketoacidosis state with IV fluids, insulin drip, aggressive electrolyte correction     ----------------------------------------------------------------------------------------------------------------------------------------------------  Time spent: > than  75  Min.   SIGNED: Kendell Bane, MD, FHM. [facility],  Pager (Please use [email or web address] to page to text) > 75 minutes of critical time was spent in reviewing all medical records, evaluating the patient, reviewing labs, drawn plan of care, admitted the patient to ICU for diabetic ketoacidotic state, and severe hyperglycemia, hyperkalemic state If 7PM-7AM, please contact night-coverage [email or web address],  08/16/2022, 3:41 PM

## 2022-08-16 NOTE — Assessment & Plan Note (Addendum)
-   Currently stable -Once tolerating p.o., resuming home medication of metoprolol,  -We will holding home medication of HCTZ/lisinopril -due to electrolyte abnormalities and AKI -As needed IV hydralazine

## 2022-08-16 NOTE — Hospital Course (Addendum)
Leah Wolf this is a 65 year old female with a history of HTN, HLD, tobacco use, left carotid artery stenosis (enterotomy 04/2013 & 04/2014), anxiety, CVA, diabetes mellitus type 2 -noncompliant with oral medication  Patient is respiratory distress with audibly wheezing and rhonchi --patient's partner who she lives with present at bedside gives detail history including her recent hospitalization at [facility]  Recently diagnosed with SARS-CoV-2, was treated with high-dose steroid Decadron, and antibiotics Presented to the ED with labored breathing and severe generalized weaknesses, blood sugars > 600  ED course: Blood pressure 125/69, pulse 100, temperature 97.7 F (36.5 C), temperature source Axillary, resp. rate (!) 26, height 5\' 4"  (1.626 m), weight 81.6 kg, SpO2 100 %.     Latest Ref Rng & Units 08/16/2022   12:35 PM 02/17/2016    8:30 AM 02/16/2016    7:38 AM  CBC  WBC 4.0 - 10.5 K/uL 13.1  8.0  6.1   Hemoglobin 12.0 - 15.0 g/dL 02/18/2016  60.1  09.3   Hematocrit 36.0 - 46.0 % 43.0  35.2  40.5   Platelets 150 - 400 K/uL 278  222  242       Latest Ref Rng & Units 08/16/2022   12:35 PM 02/17/2016    4:52 AM 02/16/2016    7:38 AM  CMP  Glucose 70 - 99 mg/dL 02/18/2016  573  220   BUN 8 - 23 mg/dL 50  5  8   Creatinine 254 - 1.00 mg/dL 2.70  6.23  7.62   Sodium 135 - 145 mmol/L 128  139  136   Potassium 3.5 - 5.1 mmol/L 7.2  4.6  4.2   Chloride 98 - 111 mmol/L 91  108  101   CO2 22 - 32 mmol/L 7  25  27    Calcium 8.9 - 10.3 mg/dL 9.2  8.6  9.2   Total Protein 6.5 - 8.1 g/dL 7.3   6.5   Total Bilirubin 0.3 - 1.2 mg/dL 1.3   0.2   Alkaline Phos 38 - 126 U/L 88   43   AST 15 - 41 U/L 70   15   ALT 0 - 44 U/L 82   13    Initially patient was thought to be septic questionable pneumonia  Sepsis protocol was initiated!  With IV fluids, broad-spectrum antibiotics and cultures  -After further evaluation it was determined the patient is in Diabetic ketoacidotic state with  hyperkalemia.  -Chest x-ray negative for any active disease Aggressive IV fluid resuscitation, insulin drip, sodium bicarb, and calcium gluconate was initiated in ED  Was asked patient to be admitted for DKA, and respite distress, COVID

## 2022-08-16 NOTE — Assessment & Plan Note (Signed)
-  Continue statins ?

## 2022-08-16 NOTE — Assessment & Plan Note (Signed)
-   History of carotid stenosis -continue home medication of statin and Plavix

## 2022-08-16 NOTE — Progress Notes (Signed)
Pharmacy Antibiotic Note  Leah Wolf is a 66 y.o. female admitted on 08/16/2022 with pneumonia.  Pharmacy has been consulted for vancomycin and cefepime dosing.  Plan: Vancomycin 2000 mg IV x 1 dose. Vancomycin 1000 mg IV every 48 hours. Cefepime 2000 mg IV every 24 hours. Monitor labs, c/s, and vanco level as indicated.  Height: 5\' 4"  (162.6 cm) Weight: 81.6 kg (180 lb) IBW/kg (Calculated) : 54.7  Temp (24hrs), Avg:97.7 F (36.5 C), Min:97.7 F (36.5 C), Max:97.7 F (36.5 C)  Recent Labs  Lab 08/16/22 1235  WBC 13.1*  CREATININE 2.35*    Estimated Creatinine Clearance: 23.7 mL/min (A) (by C-G formula based on SCr of 2.35 mg/dL (H)).    Allergies  Allergen Reactions   Percocet [Oxycodone-Acetaminophen] Anxiety   Morphine And Related Other (See Comments)    Reaction unknown    Antimicrobials this admission: Vanco 12/13 >> Cefepime 12/13 >>       Microbiology results: 12/13 BCx: pending 12/13 UCx: pending   12/13 MRSA PCR: pending  Thank you for allowing pharmacy to be a part of this patient's care.  1/14 08/16/2022 2:31 PM

## 2022-08-16 NOTE — Progress Notes (Signed)
eLink Physician-Brief Progress Note Patient Name: Leah Wolf DOB: 09/05/1955 MRN: [id number]   Date of Service  08/16/2022  HPI/Events of Note  ABG on BiPAP = 7.3/25/170/23.  eICU Interventions  Plan: Head CT Scan w/o contrast STAT.      Intervention Category Major Interventions: Change in mental status - evaluation and management  Yocelyn Brocious Dennard Nip 08/16/2022, 8:34 PM

## 2022-08-16 NOTE — ED Notes (Signed)
Respiratory at bedside.

## 2022-08-16 NOTE — ED Notes (Signed)
Respiratory called for Bipap

## 2022-08-16 NOTE — Assessment & Plan Note (Signed)
-   Currently's potassium level is at 7.2 -EKG peaked T waves-in ED patient has been initiated on calcium gluconate, bicarb drip -Initiating aggressive IV fluid resuscitation with insulin drip Anticipating steady decline in potassium level, checking and monitoring potassium level very close

## 2022-08-16 NOTE — Assessment & Plan Note (Signed)
Lab Results  Component Value Date   CREATININE 2.35 (H) 08/16/2022   CREATININE 0.62 02/17/2016   CREATININE 0.79 02/16/2016   -BUN 50, creatinine 2.35 -Acute renal sufficiency likely due to dehydration, diabetic ketoacidosis -Continue with IV fluid resuscitation, -Avoiding nephrotoxins -We will holding BP medication of HCTZ

## 2022-08-16 NOTE — ED Triage Notes (Signed)
Pt came in ED with friend. Pt having labored breathing. Friend states sugar was over 500 2 days ago. Pt slightly pale. Pt has bruising to abd and arms. Pt a/o to most. Denies pain.

## 2022-08-16 NOTE — ED Notes (Signed)
Pt placed on bipap by respiratory  

## 2022-08-16 NOTE — ED Notes (Signed)
EDP ok to proceed with only 1 set of [employer]

## 2022-08-16 NOTE — Assessment & Plan Note (Addendum)
-  Admitting to ICU -DKA protocol initiated -Likely due to recent use of steroids Decadron as an outpatient -VBG pH 7.11, pCO2 23, pO2 32, bicarb 7.1 -Serum blood sugar 663, anion gap 30, potassium 7.2  -Aggressive IV IV fluid resuscitation, initiating insulin drip -For protocol will initiate long-acting insulin, transition fluids once anion gap is closed -Obtaining A1c -Once DKA is resolved, will resume with long-acting insulin, checking CBG q. ACHS with SSI coverage -Monitoring acidosis, continue aggressive IV fluids, checking BMP every 4 hours

## 2022-08-16 NOTE — Progress Notes (Addendum)
eLink Physician-Brief Progress Note Patient Name: Leah Wolf DOB: 30-Jul-1956 MRN: [id number]   Date of Service  08/16/2022  HPI/Events of Note  Nursing reports confusion. Blood glucose = 238. Patient received Solumedrol dose at 6 PM. ABG pending. BP = 138/74. Etiology uncertain. Steroids?  eICU Interventions  Plan: Await ABG. If pCO2 is not elevated, she will need Head CT Scan STAT.     Intervention Category Major Interventions: Delirium, psychosis, severe agitation - evaluation and management  Aqil Goetting Eugene 08/16/2022, 7:58 PM

## 2022-08-16 NOTE — Progress Notes (Signed)
Patient transported from ED to ICU without any complications. 

## 2022-08-16 NOTE — Sepsis Progress Note (Signed)
eLink is following this Code Sepsis. °

## 2022-08-16 NOTE — Progress Notes (Signed)
ABG drawn and taken to lab 

## 2022-08-16 NOTE — ED Notes (Signed)
Unable to obtain [facility]. 2nd lab tech to come look for blood cultures.

## 2022-08-16 NOTE — Sepsis Progress Note (Signed)
1825 Notified bedside nurse of need to draw repeat lactic acid.  1700 Secure chat with bedside RN in ED to get order placed.

## 2022-08-16 NOTE — Consult Note (Signed)
NAME:  Leah Wolf, MRN:  [id number], DOB:  05-24-1956, LOS: 0 ADMISSION DATE:  08/16/2022, CONSULTATION DATE:  08/16/2022  REFERRING MD:  Roney Mans, CHIEF COMPLAINT:  resp distress   History of Present Illness:  66 year old smoker with recent diagnosis of diabetes type 2, hospitalized to [facility] for hyperglycemia 1 week ago.  She was treated with IV insulin, on day of discharge she was told that she was COVID-positive, Decadron was added to her regimen and she was discharged on subcutaneous insulin.  She worsened over the next 2 days and was brought in to the ED by her partner. Labs showed sugars more than 600, BUN/creatinine 50/2.35, potassium 7.2, ABG was 7.18/18/105. COVID PCR was positive She was given 3 L of fluids, started on IV insulin drip.  PCCM was consulted for respiratory distress  Pertinent  Medical History  Diabetes type 2 CVA Left carotid artery stenosis COPD, smoker  Significant Hospital Events: Including procedures, antibiotic start and stop dates in addition to other pertinent events     Interim History / Subjective:  Increased shortness of breath per RN and her partner at bedside  Objective   Blood pressure (!) 145/84, pulse (!) 114, temperature 97.7 F (36.5 C), temperature source Axillary, resp. rate (!) 21, height 5\' 4"  (1.626 m), weight 81.6 kg, SpO2 100 %.        Intake/Output Summary (Last 24 hours) at 08/16/2022 1542 Last data filed at 08/16/2022 1520 Gross per 24 hour  Intake 1300 ml  Output --  Net 1300 ml   Filed Weights   08/16/22 1400  Weight: 81.6 kg    Examination: General: Obese woman sitting up in ED stretcher with shortness of breath, audible wheezing HENT: No JVD, mild pallor, no icterus Lungs: Bilateral diffuse rhonchi, mild accessory muscle use Cardiovascular: S1-S2 tacky, no murmur Abdomen: Soft, nontender, no guarding Extremities: No deformity, no edema Neuro: Alert oriented x 3, no focal  deficits   Lactate 5.1, mild leukocytosis  Chest x-ray repeated, individually reviewed, no evidence of fluid overload, no infiltrates or effusions  Resolved Hospital Problem list     Assessment & Plan:  DKA, likely related to Decadron given for COVID infection in this patient with new onset diabetes Respiratory distress likely related to acidosis as also COPD exacerbation/bronchospasm due to COVID infection.  Chest x-ray does not show any evidence of fluid overload  DKA -being treated with IV fluids, insulin When sugars less than 250, can add dextrose Holding off IV steroids until sugars, less than 300 range  Acute respiratory distress/COPD exacerbation Does not appear to be stridor  -Use budesonide/Yupelri nebs, received albuterol and further use limited by tachycardia -Will add IV Solu-Medrol once sugars, less than 300 on insulin drip  COVID infection-no evidence of pneumonia Being treated with molnupiravir  AKI -in the setting of DKA and ACE Expect length acidosis to resolve. Expect hyperkalemia to improve with insulin drip Sodium corrects to normal    Best Practice (right click and "Reselect all SmartList Selections" daily)   Per TR[facility] Code Status:  full code Last date of multidisciplinary goals of care discussion [NA] updated partner at bedside  Labs   CBC: Recent Labs  Lab 08/16/22 1235  WBC 13.1*  NEUTROABS 11.1*  HGB 13.6  HCT 43.0  MCV 93.5  PLT 278    Basic Metabolic Panel: Recent Labs  Lab 08/16/22 1235  NA 128*  K 7.2*  CL 91*  CO2 7*  GLUCOSE 663*  BUN 50*  CREATININE 2.35*  CALCIUM 9.2   GFR: Estimated Creatinine Clearance: 23.7 mL/min (A) (by C-G formula based on SCr of 2.35 mg/dL (H)). Recent Labs  Lab 08/16/22 1232 08/16/22 1235 08/16/22 1343  WBC  --  13.1*  --   LATICACIDVEN 4.8*  --  5.1*    Liver Function Tests: Recent Labs  Lab 08/16/22 1235  AST 70*  ALT 82*  ALKPHOS 88  BILITOT 1.3*  PROT 7.3  ALBUMIN 3.3*    No results for input(s): "LIPASE", "AMYLASE" in the last 168 hours. No results for input(s): "AMMONIA" in the last 168 hours.  ABG    Component Value Date/Time   PHART 7.18 (LL) 08/16/2022 1424   PCO2ART <18 (LL) 08/16/2022 1424   PO2ART 105 08/16/2022 1424   HCO3 5.1 (L) 08/16/2022 1424   ACIDBASEDEF 21.2 (H) 08/16/2022 1424   O2SAT 99.1 08/16/2022 1424     Coagulation Profile: Recent Labs  Lab 08/16/22 1241  INR 1.1    Cardiac Enzymes: No results for input(s): "CKTOTAL", "CKMB", "CKMBINDEX", "TROPONINI" in the last 168 hours.  HbA1C: No results found for: "HGBA1C"  CBG: Recent Labs  Lab 08/16/22 1220 08/16/22 1452 08/16/22 1540  GLUCAP 584* 547* 400*    Review of Systems:   Unable to obtain detailed ROS due to respiratory distress  Past Medical History:  She,  has a past medical history of Acute CVA (cerebrovascular accident) [facility]) (June 2014), Anxiety, Carotid artery occlusion, Cervical pain (neck), Hyperlipidemia, Hypertension, Tobacco abuse, and Visual aura (2011).   Surgical History:   Past Surgical History:  Procedure Laterality Date   CAROTID ENDARTERECTOMY Left Aug. 21, 2015   cea   ENDARTERECTOMY Left 04/24/2013   Procedure: ENDARTERECTOMY CAROTID with patch angioplasty;  Surgeon: Nada Libman, MD;  Location: [facility] OR;  Service: Vascular;  Laterality: Left;   RADIOLOGY WITH ANESTHESIA N/A 02/16/2016   Procedure: STENT PLACEMENT   (RADIOLOGY WITH ANESTHESIA);  Surgeon: Julieanne Cotton, MD;  Location: [facility] OR;  Service: Radiology;  Laterality: N/A;   TONSILLECTOMY     TUBAL LIGATION       Social History:   reports that she has been smoking cigarettes. She has never used smokeless tobacco. She reports that she does not drink alcohol and does not use drugs.   Family History:  Her family history includes Heart attack in her brother, brother, and mother; Heart disease in her brother, brother, and mother; Hyperlipidemia in her brother and brother;  Hypertension in her brother and brother.   Allergies Allergies  Allergen Reactions   Percocet [Oxycodone-Acetaminophen] Anxiety   Morphine And Related Other (See Comments)    Reaction unknown     Home Medications  Prior to Admission medications   Medication Sig Start Date End Date Taking? Authorizing Provider  atorvastatin (LIPITOR) 40 MG tablet Take 40 mg by mouth daily. 07/19/22  Yes [provider]  azithromycin (ZITHROMAX) 600 MG tablet Take 600 mg by mouth daily. 08/14/22  Yes [provider]  cefdinir (OMNICEF) 300 MG capsule Take 300 mg by mouth 2 (two) times daily. 08/14/22  Yes [provider]  clopidogrel (PLAVIX) 75 MG tablet Take 75 mg by mouth daily.   Yes [provider]  dexamethasone (DECADRON) 4 MG tablet Take 4 mg by mouth 2 (two) times daily. 08/14/22  Yes [provider]  lisinopril-hydrochlorothiazide (ZESTORETIC) 20-12.5 MG tablet Take 1 tablet by mouth daily. 07/11/22  Yes [provider]  LORazepam (ATIVAN) 2 MG tablet Take 2 mg by  mouth 4 (four) times daily.    Yes [provider]  metoprolol tartrate (LOPRESSOR) 25 MG tablet Take 25 mg by mouth 2 (two) times daily. 07/19/22  Yes [provider]  tiotropium (SPIRIVA) 18 MCG inhalation capsule Place 18 mcg into inhaler and inhale daily.   Yes [provider]  Dwyane Luo 200-62.5-25 MCG/ACT AEPB Take 1 puff by mouth daily. 08/08/22  Yes [provider]  aspirin EC 81 MG EC tablet Take 1 tablet (81 mg total) by mouth daily. Patient not taking: Reported on 08/16/2022 04/25/13   Lars Mage, PA-C     Critical care time: 50 minutes       Cyril Mourning MD. [facility]. [place] [facility] Pager : [phone]  If no response to pager , please call [phone] until 7 pm After 7:00 pm call Elink  [phone]   08/16/2022

## 2022-08-16 NOTE — Assessment & Plan Note (Signed)
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 Vassie Loll--- appreciate his input and assessed

## 2022-08-16 NOTE — ED Provider Notes (Signed)
[facility] EMERGENCY DEPARTMENT Provider Note   CSN: [id number] Arrival date & time: 08/16/22  1122     History  Chief Complaint  Patient presents with   Hyperglycemia   Shortness of Breath    Leah Wolf is a 66 y.o. female.  Patient with a history of diabetes and COVID.  She comes in short of breath and weakness  The history is provided by the patient and medical records. No language interpreter was used.  Shortness of Breath Severity:  Moderate Onset quality:  Sudden Timing:  Constant Progression:  Worsening Chronicity:  New Context: activity   Relieved by:  Nothing Worsened by:  Nothing Ineffective treatments:  None tried Associated symptoms: no abdominal pain, no chest pain, no cough, no headaches and no rash        Home Medications Prior to Admission medications   Medication Sig Start Date End Date Taking? Authorizing Provider  atorvastatin (LIPITOR) 40 MG tablet Take 40 mg by mouth daily. 07/19/22  Yes [provider]  azithromycin (ZITHROMAX) 600 MG tablet Take 600 mg by mouth daily. 08/14/22  Yes [provider]  cefdinir (OMNICEF) 300 MG capsule Take 300 mg by mouth 2 (two) times daily. 08/14/22  Yes [provider]  clopidogrel (PLAVIX) 75 MG tablet Take 75 mg by mouth daily.   Yes [provider]  dexamethasone (DECADRON) 4 MG tablet Take 4 mg by mouth 2 (two) times daily. 08/14/22  Yes [provider]  lisinopril-hydrochlorothiazide (ZESTORETIC) 20-12.5 MG tablet Take 1 tablet by mouth daily. 07/11/22  Yes [provider]  LORazepam (ATIVAN) 2 MG tablet Take 2 mg by mouth 4 (four) times daily.    Yes [provider]  metoprolol tartrate (LOPRESSOR) 25 MG tablet Take 25 mg by mouth 2 (two) times daily. 07/19/22  Yes [provider]  tiotropium (SPIRIVA) 18 MCG inhalation capsule Place 18 mcg into inhaler and inhale daily.   Yes [provider]  Dwyane Luo 200-62.5-25  MCG/ACT AEPB Take 1 puff by mouth daily. 08/08/22  Yes [provider]  aspirin EC 81 MG EC tablet Take 1 tablet (81 mg total) by mouth daily. Patient not taking: Reported on 08/16/2022 04/25/13   Lars Mage, PA-C      Allergies    Percocet [oxycodone-acetaminophen] and Morphine and related    Review of Systems   Review of Systems  Constitutional:  Negative for appetite change and fatigue.  HENT:  Negative for congestion, ear discharge and sinus pressure.   Eyes:  Negative for discharge.  Respiratory:  Positive for shortness of breath. Negative for cough.   Cardiovascular:  Negative for chest pain.  Gastrointestinal:  Negative for abdominal pain and diarrhea.  Genitourinary:  Negative for frequency and hematuria.  Musculoskeletal:  Negative for back pain.  Skin:  Negative for rash.  Neurological:  Positive for weakness. Negative for seizures and headaches.  Psychiatric/Behavioral:  Negative for hallucinations.     Physical Exam Updated Vital Signs BP 125/69   Pulse 100   Temp 97.7 F (36.5 C) (Axillary)   Resp (!) 26   SpO2 100%  Physical Exam Vitals and nursing note reviewed.  Constitutional:      Appearance: She is well-developed.  HENT:     Head: Normocephalic.     Nose: Nose normal.  Eyes:     General: No scleral icterus.    Conjunctiva/sclera: Conjunctivae normal.  Neck:     Thyroid: No thyromegaly.  Cardiovascular:  Rate and Rhythm: Normal rate and regular rhythm.     Heart sounds: No murmur heard.    No friction rub. No gallop.  Pulmonary:     Breath sounds: No stridor. No wheezing or rales.     Comments: Tachypnea Chest:     Chest wall: No tenderness.  Abdominal:     General: There is no distension.     Tenderness: There is no abdominal tenderness. There is no rebound.  Musculoskeletal:        General: Normal range of motion.     Cervical back: Neck supple.  Lymphadenopathy:     Cervical: No cervical adenopathy.  Skin:    Findings:  No erythema or rash.     Comments: Bruising throughout chest  Neurological:     Mental Status: She is alert and oriented to person, place, and time.     Motor: No abnormal muscle tone.     Coordination: Coordination normal.  Psychiatric:        Behavior: Behavior normal.     ED Results / Procedures / Treatments   Labs (all labs ordered are listed, but only abnormal results are displayed) Labs Reviewed  CBC WITH DIFFERENTIAL/PLATELET - Abnormal; Notable for the following components:      Result Value   WBC 13.1 (*)    Neutro Abs 11.1 (*)    Monocytes Absolute 1.3 (*)    All other components within normal limits  COMPREHENSIVE METABOLIC PANEL - Abnormal; Notable for the following components:   Sodium 128 (*)    Potassium 7.2 (*)    Chloride 91 (*)    CO2 7 (*)    Glucose, Bld 663 (*)    BUN 50 (*)    Creatinine, Ser 2.35 (*)    Albumin 3.3 (*)    AST 70 (*)    ALT 82 (*)    Total Bilirubin 1.3 (*)    GFR, Estimated 22 (*)    Anion gap 30 (*)    All other components within normal limits  APTT - Abnormal; Notable for the following components:   aPTT 22 (*)    All other components within normal limits  BLOOD GAS, VENOUS - Abnormal; Notable for the following components:   pH, Ven 7.11 (*)    pCO2, Ven 23 (*)    Bicarbonate 7.1 (*)    Acid-base deficit 20.9 (*)    All other components within normal limits  CBG MONITORING, ED - Abnormal; Notable for the following components:   Glucose-Capillary 584 (*)    All other components within normal limits  RESP PANEL BY RT-PCR (RSV, FLU A&B, COVID)  RVPGX2  CULTURE, BLOOD (ROUTINE X 2)  URINE CULTURE  PROTIME-INR  LACTIC ACID, PLASMA  LACTIC ACID, PLASMA  URINALYSIS, ROUTINE W REFLEX MICROSCOPIC  BETA-HYDROXYBUTYRIC ACID  BETA-HYDROXYBUTYRIC ACID  CBG MONITORING, ED  CBG MONITORING, ED  TROPONIN I (HIGH SENSITIVITY)    EKG None  Radiology DG Chest Port 1 View  Result Date: 08/16/2022 CLINICAL DATA:  Shortness of  breath EXAM: PORTABLE CHEST 1 VIEW COMPARISON:  04/21/2013 FINDINGS: The heart size and mediastinal contours are within normal limits. No focal airspace consolidation, pleural effusion, or pneumothorax. The visualized skeletal structures are unremarkable. IMPRESSION: No active disease. Electronically Signed   By: Duanne GuessNicholas  Plundo D.O.   On: 08/16/2022 12:44    Procedures Procedures    Medications Ordered in ED Medications  albuterol (VENTOLIN HFA) 108 (90 Base) MCG/ACT inhaler 2 puff (2 puffs Inhalation  Given 08/16/22 1243)  vancomycin (VANCOCIN) IVPB 1000 mg/200 mL premix (1,000 mg Intravenous New Bag/Given 08/16/22 1343)  ceFEPIme (MAXIPIME) 2 g in sodium chloride 0.9 % 100 mL IVPB (2 g Intravenous New Bag/Given 08/16/22 1340)  lactated ringers bolus 20 mL/kg (has no administration in time range)  insulin regular, human (MYXREDLIN) 100 units/ 100 mL infusion (has no administration in time range)  lactated ringers infusion (has no administration in time range)  dextrose 5 % in lactated ringers infusion (has no administration in time range)  dextrose 50 % solution 0-50 mL (has no administration in time range)  sodium bicarbonate injection 50 mEq (has no administration in time range)  insulin aspart (novoLOG) injection 10 Units (has no administration in time range)  calcium gluconate inj 10% (1 g) URGENT USE ONLY! (has no administration in time range)  sodium chloride 0.9 % bolus 1,000 mL (1,000 mLs Intravenous New Bag/Given 08/16/22 1343)  sodium chloride 0.9 % bolus 2,000 mL (2,000 mLs Intravenous New Bag/Given 08/16/22 1325)    ED Course/ Medical Decision Making/ A&P  CRITICAL CARE Performed by: Bethann Berkshire Total critical care time: 45 minutes Critical care time was exclusive of separately billable procedures and treating other patients. Critical care was necessary to treat or prevent imminent or life-threatening deterioration. Critical care was time spent personally by me on the  following activities: development of treatment plan with patient and/or surrogate as well as nursing, discussions with consultants, evaluation of patient's response to treatment, examination of patient, obtaining history from patient or surrogate, ordering and performing treatments and interventions, ordering and review of laboratory studies, ordering and review of radiographic studies, pulse oximetry and re-evaluation of patient's condition.  This patient presents to the ED for concern of weakness and shortness of breath, this involves an extensive number of treatment options, and is a complaint that carries with it a high risk of complications and morbidity.  The differential diagnosis includes pneumonia, sepsis, DKA   Co morbidities that complicate the patient evaluation  Diabetes   Additional history obtained:  Additional history obtained from patient External records from outside source obtained and reviewed including hospital records   Lab Tests:  I Ordered, and personally interpreted labs.  The pertinent results include: Glucose 663, potassium 7.2, BUN 50, anion gap 30   Imaging Studies ordered:  I ordered imaging studies including chest x-ray I independently visualized and interpreted imaging which showed no acute disease I agree with the radiologist interpretation   Cardiac Monitoring: / EKG:  The patient was maintained on a cardiac monitor.  I personally viewed and interpreted the cardiac monitored which showed an underlying rhythm of: Normal sinus rhythm   Consultations Obtained:  I requested consultation with the hospitalist,  and discussed lab and imaging findings as well as pertinent plan - they recommend: Admit   Problem List / ED Course / Critical interventions / Medication management  DKA, hyperkalemia I ordered medication including insulin drip Reevaluation of the patient after these medicines showed that the patient improved I have reviewed the patients  home medicines and have made adjustments as needed   Social Determinants of Health:  None   Test / Admission - Considered:  None   Patient had DKA and hyperkalemia.  Patient was started on insulin drip, given bicarb and calcium                         Medical Decision Making Amount and/or Complexity of Data Reviewed Labs:  ordered. Radiology: ordered. ECG/medicine tests: ordered.  Risk OTC drugs. Prescription drug management. Decision regarding hospitalization.   Patient with DKA and hyperkalemia.  She will be admitted to medicine        Final Clinical Impression(s) / ED Diagnoses Final diagnoses:  None    Rx / DC Orders ED Discharge Orders     None         Bethann Berkshire, MD 08/16/22 [phone]

## 2022-08-16 NOTE — ED Notes (Signed)
IV in left shoulder infiltrated. See Avatar for new IV start.

## 2022-08-16 NOTE — ED Notes (Signed)
IV in R AC infiltrated. IV removed. Cefepime stopped.

## 2022-08-16 NOTE — Assessment & Plan Note (Signed)
-   Currently sodium is at 128, in the face of diabetic ketoacidosis with blood sugar of 663 -Anticipating rapid improvement of serum sodium level -with aggressive IV fluid resuscitation -Monitoring closely, BMP every 4 hours

## 2022-08-16 NOTE — Assessment & Plan Note (Addendum)
-   Per patient was recently diagnosed with SARS-CoV-2 as an outpatient -Inpatient screening SARS-CoV-2 positive -Negative influenza A/B -As an outpatient was treated with oral antibiotics, steroids Decadron -Steroids may have tipped the patient over hyperglycemia, diabetic ketoacidotic state -Will initiate Paxlovid  -Currently stable, satting 100% on room air -Chest x-ray clear  - continue supportive care

## 2022-08-16 NOTE — Assessment & Plan Note (Addendum)
-   Noncompliant diabetic type II (her partner has not taken her medication) -Likely developed into diabetic ketoacidosis due to recent high steroid use, Decadron for COVID treatment -Will treat diabetic ketoacidosis aggressively -Once improved, tolerating p.o., anticipating initiating long-acting insulin, and oral medications -Checking hemoglobin A1c

## 2022-08-16 NOTE — ED Notes (Signed)
IV started in left shoulder. See Avatar

## 2022-08-16 NOTE — ED Notes (Signed)
Attempting to get another IV on patient. ABX still currently infusing.

## 2022-08-16 NOTE — Progress Notes (Signed)
Pt lethargic on my assessment, prior assessments pt was A/Ox4 now A/Ox1 does not follow commands appropriately. Notified elink, will do ABG now

## 2022-08-16 NOTE — ED Notes (Signed)
Lab and 2RN attempting to draw [facility]

## 2022-08-16 NOTE — Progress Notes (Signed)
Patient transported from ICU 3 to CT then back to ICU 3 on BIPAP. No adverse events noted during transport.

## 2022-08-17 DIAGNOSIS — R768 Other specified abnormal immunological findings in serum: Secondary | ICD-10-CM

## 2022-08-17 DIAGNOSIS — N289 Disorder of kidney and ureter, unspecified: Secondary | ICD-10-CM | POA: Diagnosis not present

## 2022-08-17 DIAGNOSIS — E1110 Type 2 diabetes mellitus with ketoacidosis without coma: Secondary | ICD-10-CM | POA: Diagnosis not present

## 2022-08-17 LAB — BASIC METABOLIC PANEL
Anion gap: 10 (ref 5–15)
Anion gap: 8 (ref 5–15)
BUN: 34 mg/dL — ABNORMAL HIGH (ref 8–23)
BUN: 30 mg/dL — ABNORMAL HIGH (ref 8–23)
CO2: 17 mmol/L — ABNORMAL LOW (ref 22–32)
CO2: 18 mmol/L — ABNORMAL LOW (ref 22–32)
Calcium: 8.4 mg/dL — ABNORMAL LOW (ref 8.9–10.3)
Calcium: 8.7 mg/dL — ABNORMAL LOW (ref 8.9–10.3)
Chloride: 113 mmol/L — ABNORMAL HIGH (ref 98–111)
Chloride: 111 mmol/L (ref 98–111)
Creatinine, Ser: 1.23 mg/dL — ABNORMAL HIGH (ref 0.44–1.00)
Creatinine, Ser: 1.08 mg/dL — ABNORMAL HIGH (ref 0.44–1.00)
GFR, Estimated: 48 mL/min — ABNORMAL LOW (ref >60)
GFR, Estimated: 56 mL/min — ABNORMAL LOW (ref >60)
Glucose, Bld: 234 mg/dL — ABNORMAL HIGH (ref 70–99)
Glucose, Bld: 213 mg/dL — ABNORMAL HIGH (ref 70–99)
Potassium: 3.8 mmol/L (ref 3.5–5.1)
Potassium: 3.8 mmol/L (ref 3.5–5.1)
Sodium: 140 mmol/L (ref 135–145)
Sodium: 137 mmol/L (ref 135–145)

## 2022-08-17 LAB — APTT: aPTT: 25 seconds (ref 24–36)

## 2022-08-17 LAB — GLUCOSE, CAPILLARY
Glucose-Capillary: 131 mg/dL — ABNORMAL HIGH (ref 70–99)
Glucose-Capillary: 131 mg/dL — ABNORMAL HIGH (ref 70–99)
Glucose-Capillary: 138 mg/dL — ABNORMAL HIGH (ref 70–99)
Glucose-Capillary: 147 mg/dL — ABNORMAL HIGH (ref 70–99)
Glucose-Capillary: 147 mg/dL — ABNORMAL HIGH (ref 70–99)
Glucose-Capillary: 150 mg/dL — ABNORMAL HIGH (ref 70–99)
Glucose-Capillary: 155 mg/dL — ABNORMAL HIGH (ref 70–99)
Glucose-Capillary: 156 mg/dL — ABNORMAL HIGH (ref 70–99)
Glucose-Capillary: 156 mg/dL — ABNORMAL HIGH (ref 70–99)
Glucose-Capillary: 168 mg/dL — ABNORMAL HIGH (ref 70–99)
Glucose-Capillary: 168 mg/dL — ABNORMAL HIGH (ref 70–99)
Glucose-Capillary: 175 mg/dL — ABNORMAL HIGH (ref 70–99)
Glucose-Capillary: 194 mg/dL — ABNORMAL HIGH (ref 70–99)
Glucose-Capillary: 197 mg/dL — ABNORMAL HIGH (ref 70–99)
Glucose-Capillary: 197 mg/dL — ABNORMAL HIGH (ref 70–99)
Glucose-Capillary: 203 mg/dL — ABNORMAL HIGH (ref 70–99)
Glucose-Capillary: 207 mg/dL — ABNORMAL HIGH (ref 70–99)
Glucose-Capillary: 210 mg/dL — ABNORMAL HIGH (ref 70–99)
Glucose-Capillary: 222 mg/dL — ABNORMAL HIGH (ref 70–99)
Glucose-Capillary: 222 mg/dL — ABNORMAL HIGH (ref 70–99)
Glucose-Capillary: 222 mg/dL — ABNORMAL HIGH (ref 70–99)
Glucose-Capillary: 224 mg/dL — ABNORMAL HIGH (ref 70–99)
Glucose-Capillary: 245 mg/dL — ABNORMAL HIGH (ref 70–99)

## 2022-08-17 LAB — URINE CULTURE: Culture: <10000 — AB

## 2022-08-17 LAB — COMPREHENSIVE METABOLIC PANEL
ALT: 82 U/L — ABNORMAL HIGH (ref 0–44)
AST: 70 U/L — ABNORMAL HIGH (ref 15–41)
Albumin: 3.3 g/dL — ABNORMAL LOW (ref 3.5–5.0)
Alkaline Phosphatase: 88 U/L (ref 38–126)
Anion gap: 30 — ABNORMAL HIGH (ref 5–15)
BUN: 50 mg/dL — ABNORMAL HIGH (ref 8–23)
CO2: 7 mmol/L — ABNORMAL LOW (ref 22–32)
Calcium: 9.2 mg/dL (ref 8.9–10.3)
Chloride: 91 mmol/L — ABNORMAL LOW (ref 98–111)
Creatinine, Ser: 2.35 mg/dL — ABNORMAL HIGH (ref 0.44–1.00)
GFR, Estimated: 22 mL/min — ABNORMAL LOW (ref >60)
Glucose, Bld: 663 mg/dL (ref 70–99)
Potassium: 7.2 mmol/L (ref 3.5–5.1)
Sodium: 128 mmol/L — ABNORMAL LOW (ref 135–145)
Total Bilirubin: 1.3 mg/dL — ABNORMAL HIGH (ref 0.3–1.2)
Total Protein: 7.3 g/dL (ref 6.5–8.1)

## 2022-08-17 LAB — CBC
HCT: 34.7 % — ABNORMAL LOW (ref 36.0–46.0)
Hemoglobin: 11.5 g/dL — ABNORMAL LOW (ref 12.0–15.0)
MCH: 30.2 pg (ref 26.0–34.0)
MCHC: 33.1 g/dL (ref 30.0–36.0)
MCV: 91.1 fL (ref 80.0–100.0)
Platelets: 173 10*3/uL (ref 150–400)
RBC: 3.81 MIL/uL — ABNORMAL LOW (ref 3.87–5.11)
RDW: 14.4 % (ref 11.5–15.5)
WBC: 10.5 10*3/uL (ref 4.0–10.5)
nRBC: 0 % (ref 0.0–0.2)

## 2022-08-17 LAB — RENAL FUNCTION PANEL
Albumin: 2.5 g/dL — ABNORMAL LOW (ref 3.5–5.0)
Anion gap: 7 (ref 5–15)
BUN: 22 mg/dL (ref 8–23)
CO2: 18 mmol/L — ABNORMAL LOW (ref 22–32)
Calcium: 8.5 mg/dL — ABNORMAL LOW (ref 8.9–10.3)
Chloride: 114 mmol/L — ABNORMAL HIGH (ref 98–111)
Creatinine, Ser: 0.92 mg/dL (ref 0.44–1.00)
GFR, Estimated: >60 mL/min (ref >60)
Glucose, Bld: 246 mg/dL — ABNORMAL HIGH (ref 70–99)
Phosphorus: 1.7 mg/dL — ABNORMAL LOW (ref 2.5–4.6)
Potassium: 3.2 mmol/L — ABNORMAL LOW (ref 3.5–5.1)
Sodium: 139 mmol/L (ref 135–145)

## 2022-08-17 LAB — PROTIME-INR
INR: 0.9 (ref 0.8–1.2)
Prothrombin Time: 11.9 seconds (ref 11.4–15.2)

## 2022-08-17 LAB — BETA-HYDROXYBUTYRIC ACID: Beta-Hydroxybutyric Acid: 1.6 mmol/L — ABNORMAL HIGH (ref 0.05–0.27)

## 2022-08-17 MED ORDER — HYDRALAZINE HCL 20 MG/ML IJ SOLN
10.0000 mg | Freq: Four times a day (QID) | INTRAMUSCULAR | Status: DC | PRN
  Administered 2022-08-17 – 2022-08-18 (×2): 10 mg via INTRAVENOUS
  Filled 2022-08-17 (×2): qty 1

## 2022-08-17 MED ORDER — METOPROLOL TARTRATE 50 MG PO TABS
50.0000 mg | ORAL_TABLET | Freq: Two times a day (BID) | ORAL | Status: DC
  Administered 2022-08-17 – 2022-08-21 (×8): 50 mg via ORAL
  Filled 2022-08-17 (×8): qty 1

## 2022-08-17 MED ORDER — POTASSIUM CHLORIDE CRYS ER 20 MEQ PO TBCR
40.0000 meq | EXTENDED_RELEASE_TABLET | Freq: Once | ORAL | Status: AC
  Administered 2022-08-17: 40 meq via ORAL
  Filled 2022-08-17: qty 2

## 2022-08-17 MED ORDER — ARFORMOTEROL TARTRATE 15 MCG/2ML IN NEBU
15.0000 ug | INHALATION_SOLUTION | Freq: Two times a day (BID) | RESPIRATORY_TRACT | Status: DC
  Administered 2022-08-17 – 2022-08-21 (×8): 15 ug via RESPIRATORY_TRACT
  Filled 2022-08-17 (×8): qty 2

## 2022-08-17 MED ORDER — AMLODIPINE BESYLATE 5 MG PO TABS
10.0000 mg | ORAL_TABLET | Freq: Every day | ORAL | Status: DC
  Administered 2022-08-17 – 2022-08-21 (×5): 10 mg via ORAL
  Filled 2022-08-17 (×5): qty 2

## 2022-08-17 MED ORDER — SODIUM CHLORIDE 0.9 % IV SOLN: INTRAVENOUS | Status: DC

## 2022-08-17 MED ORDER — LEVALBUTEROL HCL 0.63 MG/3ML IN NEBU: 0.6300 mg | INHALATION_SOLUTION | Freq: Four times a day (QID) | RESPIRATORY_TRACT | Status: DC | PRN

## 2022-08-17 NOTE — Inpatient Diabetes Management (Addendum)
Inpatient Diabetes Program Recommendations  AACE/ADA: New Consensus Statement on Inpatient Glycemic Control (2015)  Target Ranges:  Prepandial:   less than 140 mg/dL      Peak postprandial:   less than 180 mg/dL (1-2 hours)      Critically ill patients:  140 - 180 mg/dL   Lab Results  Component Value Date   GLUCAP 222 (H) 08/17/2022    Review of Glycemic Control  Latest Reference Range & Units 08/17/22 03:45 08/17/22 04:47 08/17/22 05:48 08/17/22 06:42 08/17/22 07:41 08/17/22 08:49 08/17/22 10:05  Glucose-Capillary 70 - 99 mg/dL 163 (H) 845 (H) 364 (H) 197 (H) 175 (H) 194 (H) 222 (H)  (H): Data is abnormally high Diabetes history: Type 2 DM Outpatient Diabetes medications: No DM medications, recently started on Decadron 4 mg BID Current orders for Inpatient glycemic control: IV insulin Solumedrol 40 mg x 1  A1C in process  Inpatient Diabetes Program Recommendations:    When ready to transition and assuming diet advances consider: - Semglee 30 units two hours prior to discontinuation then QD to follow - Novolog 4 TID (assuming patient consuming >50% of meals) -Novolog 0-15 units TID & HS  Attempted to speak with patient via phone. No answer 2; will reattempt 12/15.  Thanks, Lujean Rave, MSN, RNC-OB Diabetes Coordinator [phone] (8a-5p)

## 2022-08-17 NOTE — TOC Initial Note (Signed)
Transition of Care [facility]) - Initial/Assessment Note    Patient Details  Name: Leah Wolf MRN: [id number] Date of Birth: 12/24/1955  Transition of [facility]) CM/SW Contact:    Leitha Bleak, RN Phone Number: 08/17/2022, 1:14 PM  Clinical Narrative:  Patient admitted with DKA and COVID positive. TOC spoke with patient and Roe Coombs. They live together. Roe Coombs provides assistance and transportation as needed. PT is recommending SNF. Patient is not agreeable. Roe Coombs states she is to weak and we need to get her better before trying to eval for PT.  He states [facility] also called wanting to set up HHPT. He states she is stubborn when she feels better she will bounce back. At this time she is also not interested in home health.  MD aware and team agrees she will need a re-eval when feeling better. TOC to follow.              Expected Discharge Plan: Home w Home Health Services Barriers to Discharge: Continued Medical Work up   Patient Goals and CMS Choice Patient states their goals for this hospitalization and ongoing recovery are:: to go home. CMS Medicare.gov Compare Post Acute Care list provided to:: Patient Choice offered to / list presented to : Patient  Expected Discharge Plan and Services Expected Discharge Plan: Home w Home Health Services       Living arrangements for the past 2 months: Single Family Home                    Prior Living Arrangements/Services Living arrangements for the past 2 months: Single Family Home Lives with:: Significant Other Patient language and need for interpreter reviewed:: Yes        Need for Family Participation in Patient Care: Yes (Comment) Care giver support system in place?: Yes (comment)   Criminal Activity/Legal Involvement Pertinent to Current Situation/Hospitalization: No - Comment as needed  Activities of Daily Living    Emotional Assessment      Orientation: : Oriented to Self, Oriented to Place, Oriented to  Time,  Oriented to Situation Alcohol / Substance Use: Not Applicable Psych Involvement: No (comment)  Admission diagnosis:  DKA (diabetic ketoacidosis) (HCC) [E11.10] Patient Active Problem List   Diagnosis Date Noted   DKA (diabetic ketoacidosis) (HCC) 08/16/2022   HTN (hypertension) 08/16/2022    Class: Chronic   HLD (hyperlipidemia) 08/16/2022    Class: Chronic   Hyperkalemia 08/16/2022    Class: Acute   Acute renal insufficiency 08/16/2022    Class: Acute   SARS-CoV-2 antibody positive 08/16/2022    Class: Chronic   Pseudohyponatremia 08/16/2022    Class: Acute   DMII (diabetes mellitus, type 2) (HCC) 08/16/2022    Class: Acute   Acute respiratory distress 08/16/2022    Class: Acute   Carotid stenosis 01/05/2014   Occlusion and stenosis of carotid artery without mention of cerebral infarction 04/07/2013   PCP:  [name], MD Pharmacy:   [facility] [address] [place] - [address] [place] [address] Phone: [phone] Fax: [phone]  [facility] - [place], [place] - [address] [place] [address] Phone: [phone] Fax: [phone]  Readmission Risk Interventions    08/17/2022    1:14 PM  Readmission Risk Prevention Plan  Transportation Screening Complete  PCP or Specialist Appt within 3-5 Days Not Complete  HRI or Home Care Consult Complete  Social Work Consult for Recovery Care Planning/Counseling Complete  Palliative Care Screening Not Applicable  Medication Review (RN Care Manager) Complete

## 2022-08-17 NOTE — Plan of Care (Signed)
  Problem: Acute Rehab PT Goals(only PT should resolve) Goal: Pt Will Go Supine/Side To Sit Outcome: Progressing Flowsheets (Taken 08/17/2022 1351) Pt will go Supine/Side to Sit: with min guard assist Goal: Patient Will Transfer Sit To/From Stand Outcome: Progressing Flowsheets (Taken 08/17/2022 1351) Patient will transfer sit to/from stand:  with min guard assist  with minimal assist Goal: Pt Will Transfer Bed To Chair/Chair To Bed Outcome: Progressing Flowsheets (Taken 08/17/2022 1351) Pt will Transfer Bed to Chair/Chair to Bed:  min guard assist  with min assist Goal: Pt Will Ambulate Outcome: Progressing Flowsheets (Taken 08/17/2022 1351) Pt will Ambulate:  50 feet  with min guard assist  with minimal assist  with rolling walker   1:52 PM, 08/17/22 Leah Wolf, MPT Physical Therapist with [facility] [phone] office [phone] mobile phone

## 2022-08-17 NOTE — Progress Notes (Addendum)
NAME:  Leah Wolf, MRN:  [id number], DOB:  12/07/1955, LOS: 1 ADMISSION DATE:  08/16/2022, CONSULTATION DATE:  08/17/2022  REFERRING MD:  Roney Mans, CHIEF COMPLAINT:  resp distress   History of Present Illness:  66 year old smoker with recent diagnosis of diabetes type 2, hospitalized to [facility] for hyperglycemia 1 week ago.  She was treated with IV insulin, on day of discharge she was told that she was COVID-positive, Decadron was added to her regimen and she was discharged on subcutaneous insulin.  She worsened over the next 2 days and was brought in to the ED by her partner. Labs showed sugars more than 600, BUN/creatinine 50/2.35, potassium 7.2, ABG was 7.18/18/105. COVID PCR was positive She was given 3 L of fluids, started on IV insulin drip.  PCCM was consulted for respiratory distress  Pertinent  Medical History  Diabetes type 2 CVA Left carotid artery stenosis COPD, smoker  Significant Hospital Events: Including procedures, antibiotic start and stop dates in addition to other pertinent events   12/13 respiratory distress requiring BiPAP. Head CT negative  Interim History / Subjective:   Much improved today. Taken off BiPAP this morning on 3 L nasal cannula. Afebrile Tachycardia has resolved Remains on insulin drip at 1.2  Objective   Blood pressure 115/72, pulse 79, temperature 97.9 F (36.6 C), temperature source Oral, resp. rate (!) 21, height 5\' 4"  (1.626 m), weight 83 kg, SpO2 97 %.    FiO2 (%):  [30 %-40 %] 30 %   Intake/Output Summary (Last 24 hours) at 08/17/2022 1320 Last data filed at 08/17/2022 1009 Gross per 24 hour  Intake 5830.6 ml  Output 1200 ml  Net 4630.6 ml    Filed Weights   08/16/22 1400 08/16/22 1756 08/17/22 0400  Weight: 81.6 kg 82.8 kg 83 kg    Examination: General: Obese woman sitting up in bed, no distress HENT: No JVD, mild pallor, no icterus Lungs: No accessory muscle use, decreased breath sounds  bilateral Cardiovascular: S1-S2 tacky, no murmur Abdomen: Soft, nontender, no guarding Extremities: No deformity, no edema Neuro: Alert oriented x 2, halting speech, aphasia   Lactate resolved, potassium 3.8, anion gap 8, beta hydroxybutyrate down to 1.60  Chest x-ray repeated, individually reviewed, no evidence of fluid overload, no infiltrates or effusions  Resolved Hospital Problem list     Assessment & Plan:  DKA, likely related to Decadron given for COVID infection in this patient with new onset diabetes Respiratory distress likely related to acidosis as also COPD exacerbation/bronchospasm due to COVID infection.  Chest x-ray does not show any evidence of fluid overload  DKA -being treated with IV fluids, insulin When sugars less than 250, can add dextrose Now that anion gap has resolved, repeat beta hydroxybutyrate and if lower can switch to subcutaneous insulin  Acute respiratory distress/COPD exacerbation Does not appear to be stridor  -Use budesonide/Yupelri nebs, add Brovana and use albuterol as needed -No need for IV steroids -No need for IV antibiotics in my opinion  COVID infection-no evidence of pneumonia Being treated with molnupiravir  AKI -in the setting of DKA and ACE Resolving  PCCM will be available as needed Outpatient follow-up to assess COPD after acute issues resolved  Best Practice (right click and "Reselect all SmartList Selections" daily)   Per TRH Code Status:  full code Last date of multidisciplinary goals of care discussion [NA] updated partner at bedside  Labs   CBC: Recent Labs  Lab 08/16/22 1235 08/17/22 0243  WBC  13.1* 10.5  NEUTROABS 11.1*  --   HGB 13.6 11.5*  HCT 43.0 34.7*  MCV 93.5 91.1  PLT 278 173     Basic Metabolic Panel: Recent Labs  Lab 08/16/22 1611 08/16/22 1837 08/16/22 2221 08/17/22 0243 08/17/22 0551  NA 134* 136 138 140 137  K 4.9 4.4 4.5 3.8 3.8  CL 106 108 110 113* 111  CO2 <7* 10* 15* 17* 18*   GLUCOSE 388* 279* 246* 234* 213*  BUN 48* 45* 41* 34* 30*  CREATININE 2.04* 1.76* 1.42* 1.23* 1.08*  CALCIUM 8.2* 8.2* 8.5* 8.4* 8.7*  MG 2.4  --   --   --   --   PHOS 5.1*  --   --   --   --     GFR: Estimated Creatinine Clearance: 51.9 mL/min (A) (by C-G formula based on SCr of 1.08 mg/dL (H)). Recent Labs  Lab 08/16/22 1232 08/16/22 1235 08/16/22 1343 08/16/22 1611 08/16/22 1837 08/16/22 2221 08/17/22 0243  PROCALCITON  --   --   --  0.36  --   --   --   WBC  --  13.1*  --   --   --   --  10.5  LATICACIDVEN 4.8*  --  5.1*  --  2.1* 1.9  --      Liver Function Tests: Recent Labs  Lab 08/16/22 1235  AST 70*  ALT 82*  ALKPHOS 88  BILITOT 1.3*  PROT 7.3  ALBUMIN 3.3*    No results for input(s): "LIPASE", "AMYLASE" in the last 168 hours. No results for input(s): "AMMONIA" in the last 168 hours.  ABG    Component Value Date/Time   PHART 7.3 (L) 08/16/2022 2012   PCO2ART 25 (L) 08/16/2022 2012   PO2ART 170 (H) 08/16/2022 2012   HCO3 12.3 (L) 08/16/2022 2012   ACIDBASEDEF 12.5 (H) 08/16/2022 2012   O2SAT 99.9 08/16/2022 2012     Coagulation Profile: Recent Labs  Lab 08/16/22 1241 08/17/22 0243  INR 1.1 0.9     Cardiac Enzymes: No results for input(s): "CKTOTAL", "CKMB", "CKMBINDEX", "TROPONINI" in the last 168 hours.  HbA1C: No results found for: "HGBA1C"  CBG: Recent Labs  Lab 08/17/22 0849 08/17/22 1005 08/17/22 1106 08/17/22 1200 08/17/22 1304  GLUCAP 194* 222* 168*  168* 131*  131* 150*     Cyril Mourning MD. FCCP. [facility] Pager : [phone]  If no response to pager , please call [phone] until 7 pm After 7:00 pm call Elink  [phone]   08/17/2022

## 2022-08-17 NOTE — Plan of Care (Signed)
  Problem: Acute Rehab OT Goals (only OT should resolve) Goal: Pt. Will Perform Grooming Flowsheets (Taken 08/17/2022 0922) Pt Will Perform Grooming:  standing  with modified independence Goal: Pt. Will Perform Lower Body Bathing Flowsheets (Taken 08/17/2022 0922) Pt Will Perform Lower Body Bathing:  sitting/lateral leans  with modified independence Goal: Pt. Will Perform Upper Body Dressing Flowsheets (Taken 08/17/2022 0922) Pt Will Perform Upper Body Dressing:  with modified independence  sitting Goal: Pt. Will Perform Lower Body Dressing Flowsheets (Taken 08/17/2022 0922) Pt Will Perform Lower Body Dressing:  with modified independence  sitting/lateral leans Goal: Pt. Will Transfer To Toilet Flowsheets (Taken 08/17/2022 [phone]) Pt Will Transfer to Toilet:  with modified independence  ambulating Goal: Pt. Will Perform Toileting-Clothing Manipulation Flowsheets (Taken 08/17/2022 0922) Pt Will Perform Toileting - Clothing Manipulation and hygiene:  with modified independence  sitting/lateral leans Goal: Pt/Caregiver Will Perform Home Exercise Program Flowsheets (Taken 08/17/2022 [phone]) Pt/caregiver will Perform Home Exercise Program:  Increased ROM  Increased strength  Both right and left upper extremity  Independently  Caitlan Chauca OT, MOT

## 2022-08-17 NOTE — Progress Notes (Signed)
PROGRESS NOTE     Leah Wolf, is a 66 y.o. female, DOB - 03-28-1956, WUJ:[id number]  Admit date - 08/16/2022   Admitting Physician Leah James, MD  Outpatient Primary MD for the patient is Leah Shad, MD  LOS - 1  Chief Complaint  Patient presents with   Hyperglycemia   Shortness of Breath        Brief Narrative:   66 year old female with past medical history relevant for DM2, HLD, HTN, obesity, and left carotid artery stenosis prior carotid endarterectomy in August 2014 and again in 2015, history of prior CVA, anxiety disorder and history of medication noncompliance admitted on 08/16/2022 with DKA in the setting of steroid therapy for COVID infection    -Assessment and Plan: 1)DKA (diabetic ketoacidosis) ([place]) -DKA--patient met DKA criteria on admission with a bicarb of <7, anion gap of 30, serum glucose of 663 and beta hydroxybutyric acid of >8.0 -Treated with aggressive IV fluids and IV insulin per Endo tool protocol -DKA pathophysiology is resolving- -metabolic acidosis improved but not resolved with bicarb currently around 18 -Beta- hydroxybutyric acid is down to 1.6 -Unfortunately glycemic control appears to be fluctuating insulin was initially down to less than 2 units/h but currently back to 6 units/hr -Reluctant to transition off IV insulin at this time given persistent acidosis and fluctuating glucose  2)Acute respiratory distress--in the setting of COVID-19 infection/COPD exacerbation -Respiratory status and work of breathing improving with improvement in metabolic acidosis and BiPAP use -In retrospect given severe metabolic acidosis it is not surprising the patient was very tachypneic on admission she was most likely trying to blow off CO2 -Chest x-ray without acute findings -Hold off on steroids given glycemic control on DKA concerns -Pulmonary critical care consult appreciated  3)Hyperkalemia -On admission  7.2 -Patient received  hyperkalemia cocktail -Potassium normalized with improved glycemic control  4)DMII (diabetes mellitus, type 2) ([place]) - Noncompliant diabetic type II (as per her partner pt had not taken her medications PTA) -- Check A1c  5)Pseudohyponatremia -Sodium normalized with improved glycemic control  6)AKI----acute kidney injury -on admission creatinine was up to 2.35 in the setting of DKA/volume depletion -Creatinine normalized with hydration - renally adjust medications, avoid nephrotoxic agents / dehydration  / hypotension -Need to hold lisinopril/HCTZ  7)HTN (hypertension) -BP trending up, add amlodipine 10 mg daily Hold lisinopril HCTZ as above #6 -IV hydralazine as needed elevated BP -Increase metoprolol to 50 twice daily  8)SARS-CoV-2 antibody positive - Per patient was recently diagnosed with SARS-CoV-2 as an outpatient -Inpatient screening SARS-CoV-2 positive -Negative influenza A/B -As an outpatient was treated with oral antibiotics, steroids Decadron -Continue molnupiravir -Check x-ray without acute findings, currently on room air  9)Carotid stenosis/left carotid artery stenosis prior carotid endarterectomy in August 2014 and again in 2015, history of prior CVA, - -Continue Lipitor and Plavix for secondary stroke prophylaxis  10) generalized weakness and deconditioning----PT recommends SNF rehab  Status is: Inpatient   Disposition: The patient is from: Home              Anticipated d/c is to: SNF              Anticipated d/c date is: 2 days              Patient currently is not medically stable to d/c. Barriers: Not Clinically Stable-   Code Status :  -  Code Status: Full Code   Family Communication:    NA (patient is alert, awake and coherent)  DVT Prophylaxis  :   - SCDs   heparin injection 5,000 Units Start: 08/16/22 1415 Place TED hose Start: 08/16/22 1412 TED hose Start: 08/16/22 1411 SCDs Start: 08/16/22 1407   Lab Results  Component Value Date   PLT 173  08/17/2022    Inpatient Medications  Scheduled Meds:  arformoterol  15 mcg Nebulization BID   ascorbic acid  500 mg Oral Daily   atorvastatin  40 mg Oral Daily   budesonide (PULMICORT) nebulizer solution  0.5 mg Nebulization BID   Chlorhexidine Gluconate Cloth  6 each Topical Daily   clopidogrel  75 mg Oral Daily   heparin  5,000 Units Subcutaneous Q8H   metoprolol tartrate  25 mg Oral BID   molnupiravir EUA  4 capsule Oral BID   revefenacin  175 mcg Nebulization Daily   sodium bicarbonate  50 mEq Intravenous Once   sodium chloride flush  3 mL Intravenous Q12H   sodium chloride flush  3 mL Intravenous Q12H   zinc sulfate  220 mg Oral Daily   Continuous Infusions:  sodium chloride     sodium chloride 125 mL/hr at 08/17/22 1112   ceFEPime (MAXIPIME) IV 2 g (08/17/22 1038)   insulin 6 Units/hr (08/17/22 1009)   [START ON 08/18/2022] vancomycin     PRN Meds:.sodium chloride, acetaminophen **OR** acetaminophen, albuterol, ALPRAZolam, bisacodyl, dextrose, hydrALAZINE, HYDROmorphone (DILAUDID) injection, levalbuterol, ondansetron **OR** ondansetron (ZOFRAN) IV, oxyCODONE, senna-docusate, sodium chloride flush, traZODone   Anti-infectives (From admission, onward)    Start     Dose/Rate Route Frequency Ordered Stop   08/18/22 1300  vancomycin (VANCOCIN) IVPB 1000 mg/200 mL premix        1,000 mg 200 mL/hr over 60 Minutes Intravenous Every 48 hours 08/16/22 1431     08/17/22 1200  ceFEPIme (MAXIPIME) 2 g in sodium chloride 0.9 % 100 mL IVPB        2 g 200 mL/hr over 30 Minutes Intravenous Every 24 hours 08/16/22 1425     08/16/22 1730  molnupiravir EUA (LAGEVRIO) capsule 800 mg        4 capsule Oral 2 times daily 08/16/22 1543 08/21/22 2159   08/16/22 1545  nirmatrelvir/ritonavir EUA (PAXLOVID) 3 tablet  Status:  Discontinued        3 tablet Oral 2 times daily 08/16/22 1537 08/16/22 1543   08/16/22 1445  vancomycin (VANCOCIN) IVPB 1000 mg/200 mL premix        1,000 mg 200 mL/hr  over 60 Minutes Intravenous  Once 08/16/22 1430 08/16/22 1810   08/16/22 1245  vancomycin (VANCOCIN) IVPB 1000 mg/200 mL premix        1,000 mg 200 mL/hr over 60 Minutes Intravenous  Once 08/16/22 1241 08/16/22 1459   08/16/22 1245  ceFEPIme (MAXIPIME) 2 g in sodium chloride 0.9 % 100 mL IVPB        2 g 200 mL/hr over 30 Minutes Intravenous  Once 08/16/22 1241 08/16/22 1440        Subjective: Donette Larry today has no fevers, no emesis,  No chest pain,   -Cough and respiratory symptoms are not worse -Tolerated BiPAP overnight -Weaned off oxygen at this time -Oral intake is fair but not great   Objective: Vitals:   08/17/22 1112 08/17/22 1200 08/17/22 1300 08/17/22 1400  BP:  115/72 (!) 148/90 (!) 181/77  Pulse:  79 85 83  Resp:  (!) _0 Temp: 97.9 F (36.6 C)     TempSrc: Oral  SpO2:  97% 99% 99%  Weight:      Height:        Intake/Output Summary (Last 24 hours) at 08/17/2022 1447 Last data filed at 08/17/2022 1300 Gross per 24 hour  Intake 5970.6 ml  Output 1200 ml  Net 4770.6 ml   Filed Weights   08/16/22 1400 08/16/22 1756 08/17/22 0400  Weight: 81.6 kg 82.8 kg 83 kg   Physical Exam  Gen:- Awake Alert,  in no apparent distress  HEENT:- [place].AT, No sclera icterus Neck-Supple Neck,No JVD,.  Lungs-  -somewhat improving air movement, no wheezing at this time CV- S1, S2 normal, regular  Abd-  +ve B.Sounds, Abd Soft, No tenderness, increased truncal adiposity Extremity/Skin:- No  edema, pedal pulses present  Psych-affect is appropriate, oriented x3 Neuro-generalized weakness, no new focal deficits, no tremors  Data Reviewed: I have personally reviewed following labs and imaging studies  CBC: Recent Labs  Lab 08/16/22 1235 08/17/22 0243  WBC 13.1* 10.5  NEUTROABS 11.1*  --   HGB 13.6 11.5*  HCT 43.0 34.7*  MCV 93.5 91.1  PLT 278 676   Basic Metabolic Panel: Recent Labs  Lab 08/16/22 1611 08/16/22 1837 08/16/22 2221 08/17/22 0243  08/17/22 0551  NA 134* 136 138 140 137  K 4.9 4.4 4.5 3.8 3.8  CL 106 108 110 113* 111  CO2 <7* 10* 15* 17* 18*  GLUCOSE 388* 279* 246* 234* 213*  BUN 48* 45* 41* 34* 30*  CREATININE 2.04* 1.76* 1.42* 1.23* 1.08*  CALCIUM 8.2* 8.2* 8.5* 8.4* 8.7*  MG 2.4  --   --   --   --   PHOS 5.1*  --   --   --   --    GFR: Estimated Creatinine Clearance: 51.9 mL/min (A) (by C-G formula based on SCr of 1.08 mg/dL (H)). Liver Function Tests: Recent Labs  Lab 08/16/22 1235  AST 70*  ALT 82*  ALKPHOS 88  BILITOT 1.3*  PROT 7.3  ALBUMIN 3.3*   Recent Results (from the past 240 hour(s))  Resp panel by RT-PCR (RSV, Flu A&B, Covid) Anterior Nasal Swab     Status: Abnormal   Collection Time: 08/16/22  1:34 PM   Specimen: Anterior Nasal Swab  Result Value Ref Range Status   SARS Coronavirus 2 by RT PCR POSITIVE (A) NEGATIVE Final    Comment: (NOTE) SARS-CoV-2 target nucleic acids are DETECTED.  The SARS-CoV-2 RNA is generally detectable in upper respiratory specimens during the acute phase of infection. Positive results are indicative of the presence of the identified virus, but do not rule out bacterial infection or co-infection with other pathogens not detected by the test. Clinical correlation with patient history and other diagnostic information is necessary to determine patient infection status. The expected result is Negative.  Fact Sheet for Patients: EntrepreneurPulse.com.au  Fact Sheet for Healthcare Providers: IncredibleEmployment.be  This test is not yet approved or cleared by the [place] FDA and  has been authorized for detection and/or diagnosis of SARS-CoV-2 by FDA under an Emergency Use Authorization (EUA).  This EUA will remain in effect (meaning this test can be used) for the duration of  the COVID-19 declaration under Section 564(b)(1) of the A ct, 21 U.S.C. section 360bbb-3(b)(1), unless the authorization is terminated or  revoked sooner.     Influenza A by PCR NEGATIVE NEGATIVE Final   Influenza B by PCR NEGATIVE NEGATIVE Final    Comment: (NOTE) The Xpert Xpress SARS-CoV-2/FLU/RSV plus assay is intended as an  aid in the diagnosis of influenza from Nasopharyngeal swab specimens and should not be used as a sole basis for treatment. Nasal washings and aspirates are unacceptable for Xpert Xpress SARS-CoV-2/FLU/RSV testing.  Fact Sheet for Patients: EntrepreneurPulse.com.au  Fact Sheet for Healthcare Providers: IncredibleEmployment.be  This test is not yet approved or cleared by the [place] FDA and has been authorized for detection and/or diagnosis of SARS-CoV-2 by FDA under an Emergency Use Authorization (EUA). This EUA will remain in effect (meaning this test can be used) for the duration of the COVID-19 declaration under Section 564(b)(1) of the Act, 21 U.S.C. section 360bbb-3(b)(1), unless the authorization is terminated or revoked.     Resp Syncytial Virus by PCR NEGATIVE NEGATIVE Final    Comment: (NOTE) Fact Sheet for Patients: EntrepreneurPulse.com.au  Fact Sheet for Healthcare Providers: IncredibleEmployment.be  This test is not yet approved or cleared by the [place] FDA and has been authorized for detection and/or diagnosis of SARS-CoV-2 by FDA under an Emergency Use Authorization (EUA). This EUA will remain in effect (meaning this test can be used) for the duration of the COVID-19 declaration under Section 564(b)(1) of the Act, 21 U.S.C. section 360bbb-3(b)(1), unless the authorization is terminated or revoked.  Performed at [facility], [address] [place], [place] [address]   Blood Culture (routine x 2)     Status: None (Preliminary result)   Collection Time: 08/16/22  1:43 PM   Specimen: Site Not Specified; Blood  Result Value Ref Range Status   Specimen Description SITE NOT SPECIFIED  Final    Special Requests   Final    BOTTLES DRAWN AEROBIC AND ANAEROBIC Blood Culture adequate volume   Culture   Final    NO GROWTH < 24 HOURS Performed at [facility], [address] [place], [place] [address]    Report Status PENDING  Incomplete  MRSA Next Gen by PCR, Nasal     Status: None   Collection Time: 08/16/22  6:00 PM   Specimen: Nasal Mucosa; Nasal Swab  Result Value Ref Range Status   MRSA by PCR Next Gen NOT DETECTED NOT DETECTED Final    Comment: (NOTE) The GeneXpert MRSA Assay (FDA approved for NASAL specimens only), is one component of a comprehensive MRSA colonization surveillance program. It is not intended to diagnose MRSA infection nor to guide or monitor treatment for MRSA infections. Test performance is not FDA approved in patients less than 9 years old. Performed at [facility], [address] [place], [place] [address]     Radiology Studies: CT HEAD WO CONTRAST (5MM)  Result Date: 08/16/2022 CLINICAL DATA:  Altered mental status EXAM: CT HEAD WITHOUT CONTRAST TECHNIQUE: Contiguous axial images were obtained from the base of the skull through the vertex without intravenous contrast. RADIATION DOSE REDUCTION: This exam was performed according to the departmental dose-optimization program which includes automated exposure control, adjustment of the mA and/or kV according to patient size and/or use of iterative reconstruction technique. COMPARISON:  None Available. FINDINGS: Brain: There is no mass, hemorrhage or extra-axial collection. The size and configuration of the ventricles and extra-axial CSF spaces are normal. Left parietal-occipital encephalomalacia and asymmetric volume loss. Vascular: No abnormal hyperdensity of the major intracranial arteries or dural venous sinuses. No intracranial atherosclerosis. Skull: The visualized skull base, calvarium and extracranial soft tissues are normal. Sinuses/Orbits: No fluid levels or advanced mucosal thickening of the  visualized paranasal sinuses. No mastoid or middle ear effusion. The orbits are normal. IMPRESSION: 1. No acute  intracranial abnormality. 2. Left parieto-occipital encephalomalacia and asymmetric volume loss. Electronically Signed   By: Ulyses Jarred M.D.   On: 08/16/2022 21:42   DG Chest Port 1 View  Result Date: 08/16/2022 CLINICAL DATA:  Short of breath, hypertension EXAM: PORTABLE CHEST 1 VIEW COMPARISON:  08/16/2022 FINDINGS: Single frontal view of the chest demonstrates an unremarkable cardiac silhouette. No airspace disease, effusion, or pneumothorax. There are no acute bony abnormalities. IMPRESSION: 1. No acute intrathoracic process. Electronically Signed   By: Randa Ngo M.D.   On: 08/16/2022 16:58   DG Chest Port 1 View  Result Date: 08/16/2022 CLINICAL DATA:  Shortness of breath EXAM: PORTABLE CHEST 1 VIEW COMPARISON:  04/21/2013 FINDINGS: The heart size and mediastinal contours are within normal limits. No focal airspace consolidation, pleural effusion, or pneumothorax. The visualized skeletal structures are unremarkable. IMPRESSION: No active disease. Electronically Signed   By: Davina Poke D.O.   On: 08/16/2022 12:44    Scheduled Meds:  arformoterol  15 mcg Nebulization BID   ascorbic acid  500 mg Oral Daily   atorvastatin  40 mg Oral Daily   budesonide (PULMICORT) nebulizer solution  0.5 mg Nebulization BID   Chlorhexidine Gluconate Cloth  6 each Topical Daily   clopidogrel  75 mg Oral Daily   heparin  5,000 Units Subcutaneous Q8H   metoprolol tartrate  25 mg Oral BID   molnupiravir EUA  4 capsule Oral BID   revefenacin  175 mcg Nebulization Daily   sodium bicarbonate  50 mEq Intravenous Once   sodium chloride flush  3 mL Intravenous Q12H   sodium chloride flush  3 mL Intravenous Q12H   zinc sulfate  220 mg Oral Daily   Continuous Infusions:  sodium chloride     sodium chloride 125 mL/hr at 08/17/22 1112   ceFEPime (MAXIPIME) IV 2 g (08/17/22 1038)   insulin 6  Units/hr (08/17/22 1009)   [START ON 08/18/2022] vancomycin      LOS: 1 day   Roxan Hockey M.D on 08/17/2022 at 2:47 PM  Go to [email or web address] - for contact info  [facility] - Office  [phone]  If 7PM-7AM, please contact night-coverage [email or web address] 08/17/2022, 2:47 PM

## 2022-08-17 NOTE — Evaluation (Signed)
Occupational Therapy Evaluation Patient Details Name: Leah Wolf MRN: [id number] DOB: December 12, 1955 Today's Date: 08/17/2022   History of Present Illness JANAI MAUDLIN this is a 66 year old female with a history of HTN, HLD, tobacco use, left carotid artery stenosis (enterotomy 04/2013 & 04/2014), anxiety, CVA, diabetes mellitus type 2 -noncompliant with oral medication     Patient is respiratory distress with audibly wheezing and rhonchi --patient's partner who she lives with present at bedside gives detail history including her recent hospitalization at [facility]     Recently diagnosed with SARS-CoV-2, was treated with high-dose steroid Decadron, and antibiotics  Presented to the ED with labored breathing and severe generalized weaknesses, blood sugars > 600 (Per MD)   Clinical Impression   Pt agreeable to OT and PT co-evaluation. Pt reports independence at baseline but today presents with weakness and need for physical assist for transfers and ADL's. Pt is also confused needing tactile and verbal cuing to follow commands at times. Pt is generally weak in B UE and needing  use of RW for safety. Pt reports no family or friend support at home at this time. Pt is a high fall risk at current level of function. Pt left in bed with call bell within reach and chair alarm set. Pt will benefit from continued OT in the hospital and recommended venue below to increase strength, balance, and endurance for safe ADL's.         Recommendations for follow up therapy are one component of a multi-disciplinary discharge planning process, led by the attending physician.  Recommendations may be updated based on patient status, additional functional criteria and insurance authorization.   Follow Up Recommendations  Skilled nursing-short term rehab (<3 hours/day)     Assistance Recommended at Discharge Frequent or constant Supervision/Assistance  Patient can return home with the following A little help with  walking and/or transfers;A lot of help with bathing/dressing/bathroom;Assistance with cooking/housework;Assist for transportation;Help with stairs or ramp for entrance    Functional Status Assessment  Patient has had a recent decline in their functional status and demonstrates the ability to make significant improvements in function in a reasonable and predictable amount of time.  Equipment Recommendations  None recommended by OT    Recommendations for Other Services       Precautions / Restrictions Precautions Precautions: Fall Restrictions Weight Bearing Restrictions: No      Mobility Bed Mobility Overal bed mobility: Needs Assistance Bed Mobility: Supine to Sit     Supine to sit: Mod assist     General bed mobility comments: Assisted needed to pull B LE to EOB and to lift trunk upright. Pt demonstrates weakness and need for physical assist.    Transfers Overall transfer level: Needs assistance Equipment used: Rolling walker (2 wheels) Transfers: Sit to/from Stand, Bed to chair/wheelchair/BSC Sit to Stand: Min assist     Step pivot transfers: Min assist     General transfer comment: Min A for initial stand pivot from EOB to chair without RW and 2 hand held assist. Pt then was able to ambulate to toilet and back with min A and use of RW. Pt struggled with safe use of RW needing physical assist to steer while ambulating.      Balance Overall balance assessment: Needs assistance Sitting-balance support: Feet supported, Bilateral upper extremity supported Sitting balance-Leahy Scale: Fair Sitting balance - Comments: seated EOB   Standing balance support: No upper extremity supported, During functional activity Standing balance-Leahy Scale: Poor Standing balance comment: poor  to fair without RW; slight improvement with RW                           ADL either performed or assessed with clinical judgement   ADL Overall ADL's : Needs assistance/impaired      Grooming: Standing;Moderate assistance       Lower Body Bathing: Maximal assistance;Sitting/lateral leans   Upper Body Dressing : Minimal assistance;Moderate assistance;Sitting   Lower Body Dressing: Maximal assistance;Sitting/lateral leans Lower Body Dressing Details (indicate cue type and reason): Pt was unable to don socks today. Max A provided from therapist while pt sat at EOB. Toilet Transfer: Minimal assistance;Rolling walker (2 wheels);Ambulation Toilet Transfer Details (indicate cue type and reason): Chair to toilet and back with min A. Slow labored movement and cuing needed for use of RW. Toileting- Clothing Manipulation and Hygiene: Sitting/lateral lean;Minimal assistance Toileting - Clothing Manipulation Details (indicate cue type and reason): This therapist needed to lift pt's gown to assist pt in peri-care while seated on the toilet.     Functional mobility during ADLs: Minimal assistance;Rolling walker (2 wheels)       Vision Baseline Vision/History: 0 No visual deficits Ability to See in Adequate Light: 0 Adequate Patient Visual Report: Other (comment) (Pt reported blurry vision in the past 24 hours but she thinks it has resolved now.) Vision Assessment?: No apparent visual deficits                Pertinent Vitals/Pain Pain Assessment Pain Assessment: No/denies pain     Hand Dominance Right   Extremity/Trunk Assessment Upper Extremity Assessment Upper Extremity Assessment: Generalized weakness (2+/5 bilaterally for shoulder flexion but WFL P/ROM. Pt generally weak.)   Lower Extremity Assessment Lower Extremity Assessment: Defer to PT evaluation   Cervical / Trunk Assessment Cervical / Trunk Assessment: Normal   Communication Communication Communication: Expressive difficulties (Some word slurring and finding difficulties at times.)   Cognition Arousal/Alertness: Awake/alert Behavior During Therapy: Flat affect Overall Cognitive Status: No  family/caregiver present to determine baseline cognitive functioning                                 General Comments: Pt was only oriented to person today. Tactile and verbal cuing needed to follow simple 1 to 2 step commands.                      Home Living Family/patient expects to be discharged to:: Private residence Living Arrangements: Alone Available Help at Discharge: Other (Comment) (Pt reports no available help.) Type of Home: Apartment Home Access: Level entry     Home Layout: One level     Bathroom Shower/Tub: Producer, television/film/video: Standard Bathroom Accessibility: No   Home Equipment: None          Prior Functioning/Environment Prior Level of Function : Independent/Modified Independent             Mobility Comments: Community ambulator without AD. Pt reports driving. ADLs Comments: Pt reports being independent at baseline.        OT Problem List: Decreased strength;Decreased range of motion;Decreased activity tolerance;Impaired balance (sitting and/or standing)      OT Treatment/Interventions: Self-care/ADL training;Therapeutic exercise;DME and/or AE instruction;Balance training;Patient/family education;Therapeutic activities    OT Goals(Current goals can be found in the care plan section) Acute Rehab OT Goals Patient Stated Goal: return home OT Goal  Formulation: With patient Time For Goal Achievement: 08/31/22 Potential to Achieve Goals: Good  OT Frequency: Min 2X/week    Co-evaluation PT/OT/SLP Co-Evaluation/Treatment: Yes Reason for Co-Treatment: To address functional/ADL transfers   OT goals addressed during session: ADL's and self-care                       End of Session Equipment Utilized During Treatment: Rolling walker (2 wheels)  Activity Tolerance: Patient tolerated treatment well Patient left: in chair;with call bell/phone within reach;with chair alarm set;with family/visitor  present  OT Visit Diagnosis: Unsteadiness on feet (R26.81);Other abnormalities of gait and mobility (R26.89);Muscle weakness (generalized) (M62.81)                Time: 5573-2202 OT Time Calculation (min): 30 min Charges:  OT General Charges $OT Visit: 1 Visit OT Evaluation $OT Eval Low Complexity: 1 Low  Reyhan Moronta OT, MOT  Danie Chandler 08/17/2022, 9:19 AM

## 2022-08-17 NOTE — Evaluation (Signed)
Physical Therapy Evaluation Patient Details Name: Leah Wolf MRN: [id number] DOB: 1955/12/04 Today's Date: 08/17/2022  History of Present Illness  Leah Wolf this is a 66 year old female with a history of HTN, HLD, tobacco use, left carotid artery stenosis (enterotomy 04/2013 & 04/2014), anxiety, CVA, diabetes mellitus type 2 -noncompliant with oral medication     Patient is respiratory distress with audibly wheezing and rhonchi --patient's partner who she lives with present at bedside gives detail history including her recent hospitalization at [facility]     Recently diagnosed with SARS-CoV-2, was treated with high-dose steroid Decadron, and antibiotics  Presented to the ED with labored breathing and severe generalized weaknesses, blood sugars > 600   Clinical Impression  Patient demonstrates slow labored movement for sitting up at bedside with frequent leaning forward, had to lean on armrest of chair and nearby objects during transfer to chair and commode, had to use RW for ambulation du to fall risk and limited to taking steps at bedside due to fatigue and generalized weakness.  Patient tolerated sitting up in chair after therapy - nursing staff aware.  Patient will benefit from continued skilled physical therapy in hospital and recommended venue below to increase strength, balance, endurance for safe ADLs and gait.          Recommendations for follow up therapy are one component of a multi-disciplinary discharge planning process, led by the attending physician.  Recommendations may be updated based on patient status, additional functional criteria and insurance authorization.  Follow Up Recommendations Skilled nursing-short term rehab (<3 hours/day) Can patient physically be transported by private vehicle: Yes    Assistance Recommended at Discharge Intermittent Supervision/Assistance  Patient can return home with the following  A lot of help with bathing/dressing/bathroom;A lot of  help with walking and/or transfers;Help with stairs or ramp for entrance;Assistance with cooking/housework    Equipment Recommendations Rolling walker (2 wheels)  Recommendations for Other Services       Functional Status Assessment Patient has had a recent decline in their functional status and demonstrates the ability to make significant improvements in function in a reasonable and predictable amount of time.     Precautions / Restrictions Precautions Precautions: Fall Restrictions Weight Bearing Restrictions: No      Mobility  Bed Mobility Overal bed mobility: Needs Assistance Bed Mobility: Supine to Sit     Supine to sit: Mod assist     General bed mobility comments: increased time, labored movement    Transfers Overall transfer level: Needs assistance Equipment used: Rolling walker (2 wheels) Transfers: Sit to/from Stand, Bed to chair/wheelchair/BSC Sit to Stand: Min assist   Step pivot transfers: Min assist       General transfer comment: has to lean on armrest of chair and nearby objects when transferring without AD, required use of RW for safety    Ambulation/Gait Ambulation/Gait assistance: Min assist, Mod assist Gait Distance (Feet): 10 Feet Assistive device: Rolling walker (2 wheels) Gait Pattern/deviations: Decreased step length - right, Decreased step length - left, Decreased stride length Gait velocity: slow     General Gait Details: very unsteady on feet when taking steps without AD, required use of RW for safety and limited to walking in room due to c/o fatigue, generalized weakness  Stairs            Wheelchair Mobility    Modified Rankin (Stroke Patients Only)       Balance Overall balance assessment: Needs assistance Sitting-balance support: Feet supported, No upper  extremity supported Sitting balance-Leahy Scale: Fair Sitting balance - Comments: seated EOB   Standing balance support: During functional activity, No upper  extremity supported Standing balance-Leahy Scale: Poor Standing balance comment: fair using RW                             Pertinent Vitals/Pain Pain Assessment Pain Assessment: No/denies pain    Home Living Family/patient expects to be discharged to:: Private residence Living Arrangements: Alone Available Help at Discharge: Other (Comment) Type of Home: Apartment Home Access: Level entry       Home Layout: One level Home Equipment: None      Prior Function Prior Level of Function : Independent/Modified Independent             Mobility Comments: Community ambulator without AD. Pt reports driving. ADLs Comments: Pt reports being independent at baseline.     Hand Dominance   Dominant Hand: Right    Extremity/Trunk Assessment   Upper Extremity Assessment Upper Extremity Assessment: Defer to OT evaluation    Lower Extremity Assessment Lower Extremity Assessment: Generalized weakness    Cervical / Trunk Assessment Cervical / Trunk Assessment: Normal  Communication   Communication: Expressive difficulties  Cognition Arousal/Alertness: Awake/alert Behavior During Therapy: Flat affect Overall Cognitive Status: No family/caregiver present to determine baseline cognitive functioning                                 General Comments: Pt was only oriented to person today. Tactile and verbal cuing needed to follow simple 1 to 2 step commands.        General Comments      Exercises     Assessment/Plan    PT Assessment Patient needs continued PT services  PT Problem List Decreased strength;Decreased activity tolerance;Decreased balance;Decreased mobility       PT Treatment Interventions DME instruction;Gait training;Stair training;Functional mobility training;Therapeutic activities;Therapeutic exercise;Balance training;Patient/family education    PT Goals (Current goals can be found in the Care Plan section)  Acute Rehab PT  Goals Patient Stated Goal: return home PT Goal Formulation: With patient Time For Goal Achievement: 08/31/22 Potential to Achieve Goals: Good    Frequency Min 3X/week     Co-evaluation PT/OT/SLP Co-Evaluation/Treatment: Yes Reason for Co-Treatment: To address functional/ADL transfers PT goals addressed during session: Mobility/safety with mobility;Balance;Proper use of DME         AM-PAC PT "6 Clicks" Mobility  Outcome Measure Help needed turning from your back to your side while in a flat bed without using bedrails?: A Little Help needed moving from lying on your back to sitting on the side of a flat bed without using bedrails?: A Lot Help needed moving to and from a bed to a chair (including a wheelchair)?: A Lot Help needed standing up from a chair using your arms (e.g., wheelchair or bedside chair)?: A Lot Help needed to walk in hospital room?: A Lot Help needed climbing 3-5 steps with a railing? : A Lot 6 Click Score: 13    End of Session   Activity Tolerance: Patient tolerated treatment well;Patient limited by fatigue Patient left: in chair;with call bell/phone within reach;with chair alarm set;with nursing/sitter in room Nurse Communication: Mobility status PT Visit Diagnosis: Unsteadiness on feet (R26.81);Other abnormalities of gait and mobility (R26.89);Muscle weakness (generalized) (M62.81)    Time: 2449-7530 PT Time Calculation (min) (ACUTE ONLY): 30 min  Charges:   PT Evaluation $PT Eval Moderate Complexity: 1 Mod PT Treatments $Therapeutic Activity: 23-37 mins        1:49 PM, 08/17/22 Ocie Bob, MPT Physical Therapist with [facility] 336 [phone] office [phone] mobile phone

## 2022-08-18 DIAGNOSIS — R768 Other specified abnormal immunological findings in serum: Secondary | ICD-10-CM | POA: Diagnosis not present

## 2022-08-18 DIAGNOSIS — E0810 Diabetes mellitus due to underlying condition with ketoacidosis without coma: Secondary | ICD-10-CM | POA: Diagnosis not present

## 2022-08-18 LAB — GLUCOSE, CAPILLARY
Glucose-Capillary: 102 mg/dL — ABNORMAL HIGH (ref 70–99)
Glucose-Capillary: 115 mg/dL — ABNORMAL HIGH (ref 70–99)
Glucose-Capillary: 122 mg/dL — ABNORMAL HIGH (ref 70–99)
Glucose-Capillary: 124 mg/dL — ABNORMAL HIGH (ref 70–99)
Glucose-Capillary: 127 mg/dL — ABNORMAL HIGH (ref 70–99)
Glucose-Capillary: 128 mg/dL — ABNORMAL HIGH (ref 70–99)
Glucose-Capillary: 133 mg/dL — ABNORMAL HIGH (ref 70–99)
Glucose-Capillary: 136 mg/dL — ABNORMAL HIGH (ref 70–99)
Glucose-Capillary: 138 mg/dL — ABNORMAL HIGH (ref 70–99)
Glucose-Capillary: 139 mg/dL — ABNORMAL HIGH (ref 70–99)
Glucose-Capillary: 142 mg/dL — ABNORMAL HIGH (ref 70–99)
Glucose-Capillary: 143 mg/dL — ABNORMAL HIGH (ref 70–99)
Glucose-Capillary: 155 mg/dL — ABNORMAL HIGH (ref 70–99)
Glucose-Capillary: 155 mg/dL — ABNORMAL HIGH (ref 70–99)
Glucose-Capillary: 156 mg/dL — ABNORMAL HIGH (ref 70–99)

## 2022-08-18 LAB — BASIC METABOLIC PANEL
Anion gap: 4 — ABNORMAL LOW (ref 5–15)
Anion gap: 6 (ref 5–15)
BUN: 19 mg/dL (ref 8–23)
BUN: 20 mg/dL (ref 8–23)
CO2: 18 mmol/L — ABNORMAL LOW (ref 22–32)
CO2: 18 mmol/L — ABNORMAL LOW (ref 22–32)
Calcium: 8.2 mg/dL — ABNORMAL LOW (ref 8.9–10.3)
Calcium: 8.2 mg/dL — ABNORMAL LOW (ref 8.9–10.3)
Chloride: 117 mmol/L — ABNORMAL HIGH (ref 98–111)
Chloride: 118 mmol/L — ABNORMAL HIGH (ref 98–111)
Creatinine, Ser: 0.8 mg/dL (ref 0.44–1.00)
Creatinine, Ser: 0.81 mg/dL (ref 0.44–1.00)
GFR, Estimated: >60 mL/min (ref >60)
GFR, Estimated: >60 mL/min (ref >60)
Glucose, Bld: 139 mg/dL — ABNORMAL HIGH (ref 70–99)
Glucose, Bld: 124 mg/dL — ABNORMAL HIGH (ref 70–99)
Potassium: 3.6 mmol/L (ref 3.5–5.1)
Potassium: 3.7 mmol/L (ref 3.5–5.1)
Sodium: 139 mmol/L (ref 135–145)
Sodium: 142 mmol/L (ref 135–145)

## 2022-08-18 LAB — CBC
HCT: 29 % — ABNORMAL LOW (ref 36.0–46.0)
Hemoglobin: 9.9 g/dL — ABNORMAL LOW (ref 12.0–15.0)
MCH: 30.4 pg (ref 26.0–34.0)
MCHC: 34.1 g/dL (ref 30.0–36.0)
MCV: 89 fL (ref 80.0–100.0)
Platelets: 153 10*3/uL (ref 150–400)
RBC: 3.26 MIL/uL — ABNORMAL LOW (ref 3.87–5.11)
RDW: 15 % (ref 11.5–15.5)
WBC: 7.6 10*3/uL (ref 4.0–10.5)
nRBC: 0 % (ref 0.0–0.2)

## 2022-08-18 LAB — HEMOGLOBIN A1C
Hgb A1c MFr Bld: 14.3 % — ABNORMAL HIGH (ref 4.8–5.6)
Mean Plasma Glucose: 364 mg/dL

## 2022-08-18 LAB — BETA-HYDROXYBUTYRIC ACID: Beta-Hydroxybutyric Acid: 0.81 mmol/L — ABNORMAL HIGH (ref 0.05–0.27)

## 2022-08-18 MED ORDER — INSULIN ASPART 100 UNIT/ML IJ SOLN
0.0000 [IU] | Freq: Three times a day (TID) | INTRAMUSCULAR | Status: DC
  Administered 2022-08-18: 4 [IU] via SUBCUTANEOUS
  Administered 2022-08-19: 7 [IU] via SUBCUTANEOUS
  Administered 2022-08-19: 4 [IU] via SUBCUTANEOUS
  Administered 2022-08-20: 3 [IU] via SUBCUTANEOUS
  Administered 2022-08-20: 4 [IU] via SUBCUTANEOUS
  Administered 2022-08-20: 3 [IU] via SUBCUTANEOUS
  Administered 2022-08-21: 4 [IU] via SUBCUTANEOUS

## 2022-08-18 MED ORDER — INSULIN ASPART 100 UNIT/ML IJ SOLN: 24.0000 [IU] | Freq: Once | INTRAMUSCULAR | Status: DC

## 2022-08-18 MED ORDER — DOXYCYCLINE HYCLATE 100 MG PO TABS
100.0000 mg | ORAL_TABLET | Freq: Two times a day (BID) | ORAL | Status: DC
  Administered 2022-08-18 – 2022-08-21 (×7): 100 mg via ORAL
  Filled 2022-08-18 (×7): qty 1

## 2022-08-18 MED ORDER — INSULIN GLARGINE-YFGN 100 UNIT/ML ~~LOC~~ SOLN
20.0000 [IU] | Freq: Every day | SUBCUTANEOUS | Status: DC
  Administered 2022-08-18 – 2022-08-21 (×4): 20 [IU] via SUBCUTANEOUS
  Filled 2022-08-18 (×5): qty 0.2

## 2022-08-18 MED ORDER — INSULIN ASPART 100 UNIT/ML IJ SOLN: 4.0000 [IU] | Freq: Three times a day (TID) | INTRAMUSCULAR | Status: DC

## 2022-08-18 MED ORDER — SODIUM BICARBONATE 650 MG PO TABS
650.0000 mg | ORAL_TABLET | Freq: Three times a day (TID) | ORAL | Status: DC
  Administered 2022-08-18 – 2022-08-21 (×10): 650 mg via ORAL
  Filled 2022-08-18 (×10): qty 1

## 2022-08-18 MED ORDER — LIVING WELL WITH DIABETES BOOK: Freq: Once | Status: AC

## 2022-08-18 MED ORDER — INSULIN ASPART 100 UNIT/ML IJ SOLN
10.0000 [IU] | Freq: Once | INTRAMUSCULAR | Status: AC
  Administered 2022-08-18: 10 [IU] via SUBCUTANEOUS

## 2022-08-18 MED ORDER — INSULIN ASPART 100 UNIT/ML IJ SOLN: 0.0000 [IU] | Freq: Every day | INTRAMUSCULAR | Status: DC

## 2022-08-18 NOTE — Progress Notes (Signed)
  RD consulted for nutrition education regarding diabetes.   Lab Results  Component Value Date   HGBA1C 14.3 (H) 08/16/2022    RD provided "General Nutrition Guidelines for Diabetes" handout. Discussed different food groups and their effects on blood sugar, emphasizing carbohydrate-containing foods. Provided list of carbohydrates and recommended serving sizes of common foods.  Discussed importance of controlled and consistent carbohydrate intake throughout the day. Patient's husband Roe Coombs educated. Patient not opening her eyes. He is her primary caregiver. She spends most of the day on the sofa except to go to bathroom or outside to smoke.  Provided examples of ways to balance meals/snacks and encouraged intake of high-fiber, whole grain complex carbohydrates. Teach back method used.   Expect good compliance.  Body mass index is 31.3 kg/m. Pt meets criteria for obese based on current BMI.  Current diet order is CHO modified, patient is consuming approximately 0-15% of meals at this time.   Labs and medications reviewed.   No further nutrition interventions warranted at this time.   Royann Shivers MS,RD,CSG,LDN Contact: Loretha Stapler

## 2022-08-18 NOTE — Progress Notes (Signed)
Molnupiravir not given at 2200. This nurse called ICU and medication was not on floor. Medication not found in patient's room or patient specific drawer. Attempted x2 to call patient contact to see if medication was accidentally taken home and was unable to speak with anyone. Press photographer and supervisor aware.

## 2022-08-18 NOTE — Progress Notes (Signed)
PROGRESS NOTE     Leah Wolf, is a 66 y.o. female, DOB - [birth year], WUJ:[id number]  Admit date - 08/16/2022   Admitting Physician [name], MD  Outpatient Primary MD for the patient is [name], MD  LOS - 2  Chief Complaint  Patient presents with   Hyperglycemia   Shortness of Breath        Brief Narrative:   66 year old female with past medical history relevant for DM2, HLD, HTN, obesity, and left carotid artery stenosis prior carotid endarterectomy in August 2014 and again in 2015, history of prior CVA, anxiety disorder and history of medication noncompliance admitted on 08/16/2022 with DKA in the setting of steroid therapy for COVID infection    -Assessment and Plan: 1)DKA (diabetic ketoacidosis) ([place]) -DKA--patient met DKA criteria on admission with a bicarb of <7, anion gap of 30, serum glucose of 663 and beta hydroxybutyric acid of >8.0 -Treated with aggressive IV fluids and IV insulin per Endo tool protocol -DKA pathophysiology is resolved -Beta- hydroxybutyric acid is down to 0.8 -Transition off IV insulin to subcu insulin   2)Acute respiratory distress--in the setting of COVID-19 infection/COPD exacerbation -Respiratory status and work of breathing improving with improvement in metabolic acidosis and BiPAP use -In retrospect given severe metabolic acidosis it is not surprising the patient was very tachypneic on admission she was most likely trying to blow off CO2 -Chest x-ray without acute findings -Hold off on steroids given glycemic control on DKA concerns -Pulmonary critical care consult appreciated  3)Hyperkalemia -On admission  7.2 -Patient received hyperkalemia cocktail -Potassium normalized with improved glycemic control  4)DMII (diabetes mellitus, type 2) ([place]) - Noncompliant diabetic type II (as per her partner pt had not taken her medications PTA) -- Check A1c  5)Pseudohyponatremia -Sodium normalized with improved  glycemic control  6)AKI----acute kidney injury -on admission creatinine was up to 2.35 in the setting of DKA/volume depletion -Creatinine normalized with hydration - renally adjust medications, avoid nephrotoxic agents / dehydration  / hypotension -Need to hold lisinopril/HCTZ  7)HTN (hypertension) -BP trending up, add amlodipine 10 mg daily Hold lisinopril HCTZ as above #6 -IV hydralazine as needed elevated BP -Increase metoprolol to 50 twice daily  8)SARS-CoV-2 antibody positive - Per patient was recently diagnosed with SARS-CoV-2 as an outpatient -Inpatient screening SARS-CoV-2 positive -Negative influenza A/B -As an outpatient was treated with oral antibiotics, steroids Decadron -Continue molnupiravir -Check x-ray without acute findings, currently on room air  9)Carotid stenosis/left carotid artery stenosis prior carotid endarterectomy in August 2014 and again in 2015, history of prior CVA, - -Continue Lipitor and Plavix for secondary stroke prophylaxis  10) generalized weakness and deconditioning----PT recommends SNF rehab  Status is: Inpatient   Disposition: The patient is from: Home              Anticipated d/c is to: SNF              Anticipated d/c date is: 2 days              Patient currently is not medically stable to d/c. Barriers: Not Clinically Stable-   Code Status :  -  Code Status: Full Code   Family Communication:    NA (patient is alert, awake and coherent)   DVT Prophylaxis  :   - SCDs   heparin injection 5,000 Units Start: 08/16/22 1415 Place TED hose Start: 08/16/22 1412 TED hose Start: 08/16/22 1411 SCDs Start: 08/16/22 1407   Lab Results  Component Value  Date   PLT 153 08/18/2022    Inpatient Medications  Scheduled Meds:  amLODipine  10 mg Oral Daily   arformoterol  15 mcg Nebulization BID   ascorbic acid  500 mg Oral Daily   atorvastatin  40 mg Oral Daily   budesonide (PULMICORT) nebulizer solution  0.5 mg Nebulization BID    Chlorhexidine Gluconate Cloth  6 each Topical Daily   clopidogrel  75 mg Oral Daily   doxycycline  100 mg Oral Q12H   heparin  5,000 Units Subcutaneous Q8H   insulin aspart  0-20 Units Subcutaneous TID WC   insulin aspart  0-5 Units Subcutaneous QHS   insulin aspart  4 Units Subcutaneous TID WC   insulin glargine-yfgn  20 Units Subcutaneous Daily   metoprolol tartrate  50 mg Oral BID   molnupiravir EUA  4 capsule Oral BID   revefenacin  175 mcg Nebulization Daily   sodium bicarbonate  50 mEq Intravenous Once   sodium bicarbonate  650 mg Oral TID   sodium chloride flush  3 mL Intravenous Q12H   sodium chloride flush  3 mL Intravenous Q12H   zinc sulfate  220 mg Oral Daily   Continuous Infusions:  sodium chloride     sodium chloride 125 mL/hr at 08/18/22 1826   PRN Meds:.sodium chloride, acetaminophen **OR** acetaminophen, albuterol, ALPRAZolam, bisacodyl, dextrose, hydrALAZINE, HYDROmorphone (DILAUDID) injection, levalbuterol, ondansetron **OR** ondansetron (ZOFRAN) IV, oxyCODONE, senna-docusate, sodium chloride flush, traZODone   Anti-infectives (From admission, onward)    Start     Dose/Rate Route Frequency Ordered Stop   08/18/22 1300  vancomycin (VANCOCIN) IVPB 1000 mg/200 mL premix  Status:  Discontinued        1,000 mg 200 mL/hr over 60 Minutes Intravenous Every 48 hours 08/16/22 1431 08/17/22 1506   08/18/22 1100  doxycycline (VIBRA-TABS) tablet 100 mg        100 mg Oral Every 12 hours 08/18/22 1004 08/23/22 0959   08/17/22 1200  ceFEPIme (MAXIPIME) 2 g in sodium chloride 0.9 % 100 mL IVPB  Status:  Discontinued        2 g 200 mL/hr over 30 Minutes Intravenous Every 24 hours 08/16/22 1425 08/18/22 1004   08/16/22 1730  molnupiravir EUA (LAGEVRIO) capsule 800 mg        4 capsule Oral 2 times daily 08/16/22 1543 08/21/22 2159   08/16/22 1545  nirmatrelvir/ritonavir EUA (PAXLOVID) 3 tablet  Status:  Discontinued        3 tablet Oral 2 times daily 08/16/22 1537 08/16/22 1543    08/16/22 1445  vancomycin (VANCOCIN) IVPB 1000 mg/200 mL premix        1,000 mg 200 mL/hr over 60 Minutes Intravenous  Once 08/16/22 1430 08/16/22 1810   08/16/22 1245  vancomycin (VANCOCIN) IVPB 1000 mg/200 mL premix        1,000 mg 200 mL/hr over 60 Minutes Intravenous  Once 08/16/22 1241 08/16/22 1459   08/16/22 1245  ceFEPIme (MAXIPIME) 2 g in sodium chloride 0.9 % 100 mL IVPB        2 g 200 mL/hr over 30 Minutes Intravenous  Once 08/16/22 1241 08/16/22 1440        Subjective: Donette Larry today has no fevers, no emesis,  No chest pain,   -Cough and shortness of breath improving -Confusional episodes from time to time   Objective: Vitals:   08/18/22 1100 08/18/22 1200 08/18/22 1300 08/18/22 1400  BP: (!) 147/59 132/72 (!) 149/62 (!) 158/63  Pulse: 76  77 81 83  Resp: _0 Temp:      TempSrc:      SpO2: 97% 100% 95% 95%  Weight:      Height:        Intake/Output Summary (Last 24 hours) at 08/18/2022 1910 Last data filed at 08/18/2022 1430 Gross per 24 hour  Intake 2568.4 ml  Output 425 ml  Net 2143.4 ml   Filed Weights   08/16/22 1756 08/17/22 0400 08/18/22 0344  Weight: 82.8 kg 83 kg 82.7 kg   Physical Exam  Gen:- Awake Alert,  in no apparent distress  HEENT:- [place].AT, No sclera icterus Neck-Supple Neck,No JVD,.  Lungs-  improving air movement, no wheezing at this time CV- S1, S2 normal, regular  Abd-  +ve B.Sounds, Abd Soft, No tenderness, increased truncal adiposity Extremity/Skin:- No  edema, pedal pulses present  Psych-patient episodes of confusion and disorientation, as per husband this happens at times  neuro-generalized weakness, no new focal deficits, no tremors  Data Reviewed: I have personally reviewed following labs and imaging studies  CBC: Recent Labs  Lab 08/16/22 1235 08/17/22 0243 08/18/22 0149  WBC 13.1* 10.5 7.6  NEUTROABS 11.1*  --   --   HGB 13.6 11.5* 9.9*  HCT 43.0 34.7* 29.0*  MCV 93.5 91.1 89.0  PLT 278 173 423    Basic Metabolic Panel: Recent Labs  Lab 08/16/22 1611 08/16/22 1837 08/17/22 0243 08/17/22 0551 08/17/22 1732 08/18/22 0149 08/18/22 0429  NA 134*   < > 140 137 139 139 142  K 4.9   < > 3.8 3.8 3.2* 3.6 3.7  CL 106   < > 113* 111 114* 117* 118*  CO2 <7*   < > 17* 18* 18* 18* 18*  GLUCOSE 388*   < > 234* 213* 246* 139* 124*  BUN 48*   < > 34* 30* _1 CREATININE 2.04*   < > 1.23* 1.08* 0.92 0.80 0.81  CALCIUM 8.2*   < > 8.4* 8.7* 8.5* 8.2* 8.2*  MG 2.4  --   --   --   --   --   --   PHOS 5.1*  --   --   --  1.7*  --   --    < > = values in this interval not displayed.   GFR: Estimated Creatinine Clearance: 69.2 mL/min (by C-G formula based on SCr of 0.81 mg/dL). Liver Function Tests: Recent Labs  Lab 08/16/22 1235 08/17/22 1732  AST 70*  --   ALT 82*  --   ALKPHOS 88  --   BILITOT 1.3*  --   PROT 7.3  --   ALBUMIN 3.3* 2.5*   Recent Results (from the past 240 hour(s))  Resp panel by RT-PCR (RSV, Flu A&B, Covid) Anterior Nasal Swab     Status: Abnormal   Collection Time: 08/16/22  1:34 PM   Specimen: Anterior Nasal Swab  Result Value Ref Range Status   SARS Coronavirus 2 by RT PCR POSITIVE (A) NEGATIVE Final    Comment: (NOTE) SARS-CoV-2 target nucleic acids are DETECTED.  The SARS-CoV-2 RNA is generally detectable in upper respiratory specimens during the acute phase of infection. Positive results are indicative of the presence of the identified virus, but do not rule out bacterial infection or co-infection with other pathogens not detected by the test. Clinical correlation with patient history and other diagnostic information is necessary to determine patient infection status. The expected result is Negative.  Fact Sheet for Patients: EntrepreneurPulse.com.au  Fact Sheet for Healthcare Providers: IncredibleEmployment.be  This test is not yet approved or cleared by the [place] FDA and  has been authorized for  detection and/or diagnosis of SARS-CoV-2 by FDA under an Emergency Use Authorization (EUA).  This EUA will remain in effect (meaning this test can be used) for the duration of  the COVID-19 declaration under Section 564(b)(1) of the A ct, 21 U.S.C. section 360bbb-3(b)(1), unless the authorization is terminated or revoked sooner.     Influenza A by PCR NEGATIVE NEGATIVE Final   Influenza B by PCR NEGATIVE NEGATIVE Final    Comment: (NOTE) The Xpert Xpress SARS-CoV-2/FLU/RSV plus assay is intended as an aid in the diagnosis of influenza from Nasopharyngeal swab specimens and should not be used as a sole basis for treatment. Nasal washings and aspirates are unacceptable for Xpert Xpress SARS-CoV-2/FLU/RSV testing.  Fact Sheet for Patients: EntrepreneurPulse.com.au  Fact Sheet for Healthcare Providers: IncredibleEmployment.be  This test is not yet approved or cleared by the [place] FDA and has been authorized for detection and/or diagnosis of SARS-CoV-2 by FDA under an Emergency Use Authorization (EUA). This EUA will remain in effect (meaning this test can be used) for the duration of the COVID-19 declaration under Section 564(b)(1) of the Act, 21 U.S.C. section 360bbb-3(b)(1), unless the authorization is terminated or revoked.     Resp Syncytial Virus by PCR NEGATIVE NEGATIVE Final    Comment: (NOTE) Fact Sheet for Patients: EntrepreneurPulse.com.au  Fact Sheet for Healthcare Providers: IncredibleEmployment.be  This test is not yet approved or cleared by the [place] FDA and has been authorized for detection and/or diagnosis of SARS-CoV-2 by FDA under an Emergency Use Authorization (EUA). This EUA will remain in effect (meaning this test can be used) for the duration of the COVID-19 declaration under Section 564(b)(1) of the Act, 21 U.S.C. section 360bbb-3(b)(1), unless the authorization is  terminated or revoked.  Performed at [facility], [address] [place], [place] [address]   Blood Culture (routine x 2)     Status: None (Preliminary result)   Collection Time: 08/16/22  1:43 PM   Specimen: Site Not Specified; Blood  Result Value Ref Range Status   Specimen Description SITE NOT SPECIFIED  Final   Special Requests   Final    BOTTLES DRAWN AEROBIC AND ANAEROBIC Blood Culture adequate volume   Culture   Final    NO GROWTH 2 DAYS Performed at [facility], [address], [place], [place] [address]    Report Status PENDING  Incomplete  MRSA Next Gen by PCR, Nasal     Status: None   Collection Time: 08/16/22  6:00 PM   Specimen: Nasal Mucosa; Nasal Swab  Result Value Ref Range Status   MRSA by PCR Next Gen NOT DETECTED NOT DETECTED Final    Comment: (NOTE) The GeneXpert MRSA Assay (FDA approved for NASAL specimens only), is one component of a comprehensive MRSA colonization surveillance program. It is not intended to diagnose MRSA infection nor to guide or monitor treatment for MRSA infections. Test performance is not FDA approved in patients less than 49 years old. Performed at [facility], [address] [place], [place] [address]   Urine Culture     Status: Abnormal   Collection Time: 08/16/22  6:40 PM   Specimen: Urine, Clean Catch  Result Value Ref Range Status   Specimen Description   Final    URINE, CLEAN CATCH Performed at [facility],  [address], [place], [place] [address]    Special Requests   Final    NONE Performed at [facility], [address], [place], [place] [address]    Culture (A)  Final    <10,000 COLONIES/mL INSIGNIFICANT GROWTH Performed at [facility] [address], [place], [place] [address]    Report Status 08/17/2022 FINAL  Final    Radiology Studies: CT HEAD WO CONTRAST (5MM)  Result Date: 08/16/2022 CLINICAL DATA:  Altered mental status EXAM: CT HEAD WITHOUT CONTRAST TECHNIQUE: Contiguous axial images were  obtained from the base of the skull through the vertex without intravenous contrast. RADIATION DOSE REDUCTION: This exam was performed according to the departmental dose-optimization program which includes automated exposure control, adjustment of the mA and/or kV according to patient size and/or use of iterative reconstruction technique. COMPARISON:  None Available. FINDINGS: Brain: There is no mass, hemorrhage or extra-axial collection. The size and configuration of the ventricles and extra-axial CSF spaces are normal. Left parietal-occipital encephalomalacia and asymmetric volume loss. Vascular: No abnormal hyperdensity of the major intracranial arteries or dural venous sinuses. No intracranial atherosclerosis. Skull: The visualized skull base, calvarium and extracranial soft tissues are normal. Sinuses/Orbits: No fluid levels or advanced mucosal thickening of the visualized paranasal sinuses. No mastoid or middle ear effusion. The orbits are normal. IMPRESSION: 1. No acute intracranial abnormality. 2. Left parieto-occipital encephalomalacia and asymmetric volume loss. Electronically Signed   By: Ulyses Jarred M.D.   On: 08/16/2022 21:42    Scheduled Meds:  amLODipine  10 mg Oral Daily   arformoterol  15 mcg Nebulization BID   ascorbic acid  500 mg Oral Daily   atorvastatin  40 mg Oral Daily   budesonide (PULMICORT) nebulizer solution  0.5 mg Nebulization BID   Chlorhexidine Gluconate Cloth  6 each Topical Daily   clopidogrel  75 mg Oral Daily   doxycycline  100 mg Oral Q12H   heparin  5,000 Units Subcutaneous Q8H   insulin aspart  0-20 Units Subcutaneous TID WC   insulin aspart  0-5 Units Subcutaneous QHS   insulin aspart  4 Units Subcutaneous TID WC   insulin glargine-yfgn  20 Units Subcutaneous Daily   metoprolol tartrate  50 mg Oral BID   molnupiravir EUA  4 capsule Oral BID   revefenacin  175 mcg Nebulization Daily   sodium bicarbonate  50 mEq Intravenous Once   sodium bicarbonate  650 mg  Oral TID   sodium chloride flush  3 mL Intravenous Q12H   sodium chloride flush  3 mL Intravenous Q12H   zinc sulfate  220 mg Oral Daily   Continuous Infusions:  sodium chloride     sodium chloride 125 mL/hr at 08/18/22 1826    LOS: 2 days   Roxan Hockey M.D on 08/18/2022 at 7:10 PM  Go to [email or web address] - for contact info  [facility] - Office  [phone]  If 7PM-7AM, please contact night-coverage [email or web address] 08/18/2022, 7:10 PM

## 2022-08-18 NOTE — Progress Notes (Signed)
Pt ambulated from bed to bsc, then to chair with 2 assist and walker. Pt adamant about refusing mobility, but able to eventually persuade to move. Sitting in chair currently awaiting dinner tray with husband at bedside. VSS.

## 2022-08-18 NOTE — Progress Notes (Signed)
Physical Therapy Treatment Patient Details Name: Leah Wolf MRN: [id number] DOB: 1956-05-23 Today's Date: 08/18/2022   History of Present Illness Leah Wolf this is a 66 year old female with a history of HTN, HLD, tobacco use, left carotid artery stenosis (enterotomy 04/2013 & 04/2014), anxiety, CVA, diabetes mellitus type 2 -noncompliant with oral medication     Patient is respiratory distress with audibly wheezing and rhonchi --patient's partner who she lives with present at bedside gives detail history including her recent hospitalization at [facility]     Recently diagnosed with SARS-CoV-2, was treated with high-dose steroid Decadron, and antibiotics  Presented to the ED with labored breathing and severe generalized weaknesses, blood sugars > 600    PT Comments    Patient demonstrates slow labored movement for sitting up at bedside, fair/poor carryover for completing BLE exercises requiring repeated verbal cues and demonstration, limited to a few steps at bedside and requested to go back to bed after therapy due to c/o fatigue.  Patient will benefit from continued skilled physical therapy in hospital and recommended venue below to increase strength, balance, endurance for safe ADLs and gait.     Recommendations for follow up therapy are one component of a multi-disciplinary discharge planning process, led by the attending physician.  Recommendations may be updated based on patient status, additional functional criteria and insurance authorization.  Follow Up Recommendations  Skilled nursing-short term rehab (<3 hours/day) Can patient physically be transported by private vehicle: Yes   Assistance Recommended at Discharge Intermittent Supervision/Assistance  Patient can return home with the following A lot of help with bathing/dressing/bathroom;A lot of help with walking and/or transfers;Help with stairs or ramp for entrance;Assistance with cooking/housework   Equipment  Recommendations  Rolling walker (2 wheels)    Recommendations for Other Services       Precautions / Restrictions Precautions Precautions: Fall Restrictions Weight Bearing Restrictions: No     Mobility  Bed Mobility Overal bed mobility: Needs Assistance Bed Mobility: Supine to Sit, Sit to Supine     Supine to sit: Mod assist Sit to supine: Min assist   General bed mobility comments: increased time, labored movement    Transfers Overall transfer level: Needs assistance Equipment used: Rolling walker (2 wheels) Transfers: Sit to/from Stand, Bed to chair/wheelchair/BSC Sit to Stand: Min assist   Step pivot transfers: Min assist       General transfer comment: unsteady labored movement    Ambulation/Gait Ambulation/Gait assistance: Min assist, Mod assist Gait Distance (Feet): 4 Feet Assistive device: Rolling walker (2 wheels) Gait Pattern/deviations: Decreased step length - right, Decreased step length - left, Decreased stride length Gait velocity: slow     General Gait Details: limited to a few steps at bedside mostly due c/o fatigue and generalized weakness   Stairs             Wheelchair Mobility    Modified Rankin (Stroke Patients Only)       Balance Overall balance assessment: Needs assistance Sitting-balance support: Feet supported, No upper extremity supported Sitting balance-Leahy Scale: Fair Sitting balance - Comments: seated EOB   Standing balance support: During functional activity, No upper extremity supported Standing balance-Leahy Scale: Poor Standing balance comment: fair using RW                            Cognition Arousal/Alertness: Awake/alert, Lethargic Behavior During Therapy: WFL for tasks assessed/performed Overall Cognitive Status: Within Functional Limits for tasks assessed  Exercises General Exercises - Lower Extremity Ankle Circles/Pumps:  Seated, AROM, Strengthening, Both, 5 reps Long Arc Quad: Seated, AROM, Strengthening, Both, 5 reps    General Comments        Pertinent Vitals/Pain Pain Assessment Pain Assessment: No/denies pain    Home Living                          Prior Function            PT Goals (current goals can now be found in the care plan section) Acute Rehab PT Goals Patient Stated Goal: return home PT Goal Formulation: With patient Time For Goal Achievement: 08/31/22 Potential to Achieve Goals: Good Progress towards PT goals: Progressing toward goals    Frequency    Min 3X/week      PT Plan Current plan remains appropriate    Co-evaluation              AM-PAC PT "6 Clicks" Mobility   Outcome Measure  Help needed turning from your back to your side while in a flat bed without using bedrails?: A Little Help needed moving from lying on your back to sitting on the side of a flat bed without using bedrails?: A Lot Help needed moving to and from a bed to a chair (including a wheelchair)?: A Lot Help needed standing up from a chair using your arms (e.g., wheelchair or bedside chair)?: A Lot Help needed to walk in hospital room?: A Lot Help needed climbing 3-5 steps with a railing? : A Lot 6 Click Score: 13    End of Session   Activity Tolerance: Patient limited by fatigue;Patient tolerated treatment well Patient left: in bed;with call bell/phone within reach;with family/visitor present Nurse Communication: Mobility status PT Visit Diagnosis: Unsteadiness on feet (R26.81);Other abnormalities of gait and mobility (R26.89);Muscle weakness (generalized) (M62.81)     Time: 6599-3570 PT Time Calculation (min) (ACUTE ONLY): 29 min  Charges:  $Therapeutic Activity: 23-37 mins                     2:17 PM, 08/18/22 Ocie Bob, MPT Physical Therapist with [facility] [phone] office [phone] mobile phone

## 2022-08-18 NOTE — Inpatient Diabetes Management (Addendum)
Inpatient Diabetes Program Recommendations  AACE/ADA: New Consensus Statement on Inpatient Glycemic Control   Target Ranges:  Prepandial:   less than 140 mg/dL      Peak postprandial:   less than 180 mg/dL (1-2 hours)      Critically ill patients:  140 - 180 mg/dL    Latest Reference Range & Units 08/18/22 05:27 08/18/22 06:18 08/18/22 07:27 08/18/22 08:28  Glucose-Capillary 70 - 99 mg/dL 127 (H) 115 (H) 136 (H) 143 (H)    Latest Reference Range & Units 08/18/22 04:29  CO2 22 - 32 mmol/L 18 (L)  Glucose 70 - 99 mg/dL 124 (H)  Anion gap 5 - 15  6    Latest Reference Range & Units 08/18/22 04:29  Beta-Hydroxybutyric Acid 0.05 - 0.27 mmol/L 0.81 (H)   Review of Glycemic Control  Diabetes history: DM2 (recently new dx of DM per H&P) Outpatient Diabetes medications: None listed on med list; per H&P, discharged from [facility] on insulin 1 week ago; Decadron 4 mg BID Current orders for Inpatient glycemic control: IV insulin  Inpatient Diabetes Program Recommendations:    Insulin: Once provider is ready to transition from IV to SQ insulin, please consider ordering Semglee 20 units Q24H (based on 82.7 kg x 0.25 units), CBGs Q4H, Novolog 0-15 units Q4H, and Novolog 3 units TID with meals for meal coverage if patient eats at least 50% of meals.  HbgA1C:  A1C in process.  Outpatient DM: At time of discharge, please provide Rx for glucose monitoring kit (#[id number]), insulin pens, and insulin pen needles.   NOTE: Patient admitted with DKA, respiratory distress, COVID, hyperkalemia, acute renal insufficiency, and hypertension. Patient was ordered IV insulin and has remained on IV insulin since admitted. Will plan to speak with patient today regarding DM.  Addendum 08/18/22_0 :00-Spoke with patient and her friend Timmothy Sours at bedside. Patient does not provide much information but will answer some questions with 1-3 word responses. Timmothy Sours reports patient has had CVA in past and has trouble  communicating. Patient has had DM for a while but not taken DM medications. Timmothy Sours reports that patient sees her PCP 3-4 times a year and they talk to patient about her DM every visit. Timmothy Sours states that patient has tried Ozempic in the past (made her sick so it was stopped) and recently when she seen her PCP on 07/31/22 PCP asked patient if she was going to start insulin or oral DM medications since her DM control was getting worse. Patient told PCP she was start oral medication and was prescribed Farxiga. Timmothy Sours stated that when patient went to [facility] and discharged 1 week ago, they discharged her on insulin (10 units) but no education was provided and patient has not taken any insulin Timmothy Sours reports not filled and not sure if pharmacy has a prescription for it or not). Timmothy Sours reports patient did not want to take the [place] until a few days before admission she agreed to take it a couple of times because she was feeling so bad. Patient does not have a glucometer at home and patient nor Timmothy Sours have much knowledge about DM. Educated patient and Timmothy Sours as if new DM dx. Explained what an A1C is and informed patient that current A1C is still in process. Discussed basic pathophysiology of DM Type 2, basic home care, importance of checking CBGs and maintaining good CBG control to prevent long-term and short-term complications. Reviewed glucose and A1C goals and explained that patient will most likely be discharged on insulin.  Reviewed signs and symptoms of hyperglycemia and hypoglycemia along with treatment for both. Discussed impact of nutrition, exercise, stress, sickness, and medications on diabetes control. Discussed Carb Modified diet and informed patient and Timmothy Sours that RD was consulted and should be talking with them more about diet.  Reviewed Living Well with diabetes booklet and encouraged patient and Timmothy Sours to read through entire book. Several times during conversation, would ask patient questions about information just  discussed and patient was not able to answer any of the questions and stated "I don't know" to almost every one. Asked patient if she would be willing to take insulin at home and she did reply "Yes". Educated patient and Don on insulin pen use at home. Reviewed all steps of insulin pen including attachment of needle, 2-unit air shot, dialing up dose, giving injection, removing needle, disposal of sharps, storage of unused insulin, disposal of insulin etc. Patient was NOT able to provide return demonstration (was going to put the insulin pen (without needle) in her mouth so taken away from patient at that time) but Timmothy Sours was able to provide successful return demonstration of how to use an insulin pen. Discussed insulin storage, injection sites, importance of rotation injection sites, and disposal of sharps.  Timmothy Sours states that he has been helping patient at home for a long time and he plans to take her home at discharge because she does not want to go to a facility. Timmothy Sours reports that patient sleeps on couch at home and he gives her medications if she wants to take them. Timmothy Sours is agreeable to give patient her insulin at home but states he would want home health to come out several times a week for several weeks to make sure he is doing things correctly and to provide help with patient until she is able to do more for herself. Informed Timmothy Sours I would make sure TOC is consulted for assistance at discharge.  Patient still not able to answer any questions about DM or insulin.  Don verbalized understanding of information discussed and he states that he has no further questions at this time related to diabetes.   RNs to provide ongoing basic DM education at bedside with this patient and engage patient to actively check blood glucose and administer insulin injections. During discussion with patient and Timmothy Sours, he had an episode of sweating and blurry vision which lasted a couple minutes. He stated he does not have DM but he has heart  issues and has had a heart attack in past. He denied any chest pain or arm tingling or numbness during episode. Timmothy Sours stated that he felt that the symptoms were due to him being overwhelmed with everything going on with patient. After a couple minutes, Timmothy Sours reported being over the episode and feeling fine. Encouraged Don to get medical care if he had any other episodes to ensure it wasn't heart related. Also informed Levada Dy, RN regarding discussion with patient and Timmothy Sours and that Timmothy Sours will be the one to provide care for patient's DM and give insulin to patient at home. Also informed RN about episode with Timmothy Sours of sweating and blurry vision. Patient and Timmothy Sours will need ongoing education regarding DM and insulin. Placed order for outpatient DM education (provider co-sign required).   Thanks, Barnie Alderman, RN, MSN, [place] Diabetes Coordinator Inpatient Diabetes Program [phone] (Team Pager from 8am to [place])

## 2022-08-18 NOTE — TOC Progression Note (Signed)
Transition of Care [facility]) - Progression Note    Patient Details  Name: Leah Wolf MRN: [id number] Date of Birth: 10-04-1955  Transition of Care [facility] CM/SW Contact  Leitha Bleak, RN Phone Number: 08/18/2022, 1:16 PM  Clinical Narrative:   Patient now agreeable to home health.  [facility] already had a referral [facility]. New orders, facesheet, PT/OT eval and H&P emailed to [email or web address]    Expected Discharge Plan: Home w Home Health Services Barriers to Discharge: Continued Medical Work up  Expected Discharge Plan and Services Expected Discharge Plan: Home w Home Health Services       Living arrangements for the past 2 months: Single Family Home          HH Agency: [facility] Date HH Agency Contacted: 08/18/22 Time HH Agency Contacted: 1316 Representative spoke with at [facility] Agency: Emailing orders and records    Readmission Risk Interventions    08/17/2022    1:14 PM  Readmission Risk Prevention Plan  Transportation Screening Complete  PCP or Specialist Appt within 3-5 Days Not Complete  HRI or Home Care Consult Complete  Social Work Consult for Recovery Care Planning/Counseling Complete  Palliative Care Screening Not Applicable  Medication Review [employer]) Complete

## 2022-08-18 NOTE — Progress Notes (Signed)
Brought patient to floor from [facility], nurse had just given xanax. Arrived to room 312 husband by side. No complaints.

## 2022-08-18 NOTE — Progress Notes (Signed)
Pt husband Roe Coombs) able to return demonstrate how to check blood sugar.

## 2022-08-19 DIAGNOSIS — I10 Essential (primary) hypertension: Secondary | ICD-10-CM

## 2022-08-19 LAB — CBC
HCT: 31.2 % — ABNORMAL LOW (ref 36.0–46.0)
Hemoglobin: 10.4 g/dL — ABNORMAL LOW (ref 12.0–15.0)
MCH: 30 pg (ref 26.0–34.0)
MCHC: 33.3 g/dL (ref 30.0–36.0)
MCV: 89.9 fL (ref 80.0–100.0)
Platelets: 181 10*3/uL (ref 150–400)
RBC: 3.47 MIL/uL — ABNORMAL LOW (ref 3.87–5.11)
RDW: 15.7 % — ABNORMAL HIGH (ref 11.5–15.5)
WBC: 5.8 10*3/uL (ref 4.0–10.5)
nRBC: 0.3 % — ABNORMAL HIGH (ref 0.0–0.2)

## 2022-08-19 LAB — GLUCOSE, CAPILLARY
Glucose-Capillary: 163 mg/dL — ABNORMAL HIGH (ref 70–99)
Glucose-Capillary: 250 mg/dL — ABNORMAL HIGH (ref 70–99)
Glucose-Capillary: 112 mg/dL — ABNORMAL HIGH (ref 70–99)
Glucose-Capillary: 151 mg/dL — ABNORMAL HIGH (ref 70–99)

## 2022-08-19 MED ORDER — IPRATROPIUM-ALBUTEROL 0.5-2.5 (3) MG/3ML IN SOLN
3.0000 mL | Freq: Four times a day (QID) | RESPIRATORY_TRACT | Status: DC
  Administered 2022-08-19 – 2022-08-20 (×3): 3 mL via RESPIRATORY_TRACT
  Filled 2022-08-19 (×2): qty 3

## 2022-08-19 NOTE — Progress Notes (Signed)
Molnupiravir not given this morning due to it not being in the room. Attempted calling the friend and did not get an answer. MD Emokpae notified. Viviann Spare from pharmacy was called as well and stated okay to not give the dose this morning and he would get in touch with MD Emokpae to verify for later doses.

## 2022-08-19 NOTE — Progress Notes (Addendum)
PROGRESS NOTE     Leah Wolf, is a 66 y.o. female, DOB - [birth year], BMW:[id number]  Admit date - 08/16/2022   Admitting Physician Kendell Bane, MD  Outpatient Primary MD for the patient is Leah Robes, MD  LOS - 3  Chief Complaint  Patient presents with   Hyperglycemia   Shortness of Breath        Brief Narrative:   66 year old female with past medical history relevant for DM2, HLD, HTN, obesity, and left carotid artery stenosis prior carotid endarterectomy in August 2014 and again in 2015, history of prior CVA, anxiety disorder and history of medication noncompliance admitted on 08/16/2022 with DKA in the setting of steroid therapy for COVID infection    -Assessment and Plan: 1)DKA (diabetic ketoacidosis) (HCC) -DKA--patient met DKA criteria on admission with a bicarb of <7, anion gap of 30, serum glucose of 663 and beta hydroxybutyric acid of >8.0 -Treated with aggressive IV fluids and IV insulin per Endo tool protocol -DKA pathophysiology is resolved -Beta- hydroxybutyric acid is down to 0.8 -Transition off IV insulin to subcu insulin  2)Acute respiratory distress--in the setting of COVID-19 infection/COPD exacerbation -Respiratory status and work of breathing improving with improvement in metabolic acidosis and BiPAP use -In retrospect given severe metabolic acidosis it is not surprising the patient was very tachypneic on admission she was most likely trying to blow off CO2 -Chest x-ray without acute findings -Hold off on steroids given glycemic control on DKA concerns -Pulmonary critical care consult appreciated =-Patient was treated with Paxlovid -Initially diagnosed with COVID-19 infection over a week ago at [facility] hospital  3)Hyperkalemia -On admission  7.2 -Patient received hyperkalemia cocktail -Potassium normalized with improved glycemic control  4)DMII (diabetes mellitus, type 2) (HCC) - Noncompliant diabetic type II (as per her partner  pt had not taken her medications PTA) -- A1c is 14.3 reflecting uncontrolled diabetes with hyperglycemia PTA -Continue scheduled Lantus insulin Use Novolog/Humalog Sliding scale insulin with Accu-Cheks/Fingersticks as ordered   5)Pseudohyponatremia -Sodium normalized with improved glycemic control  6)AKI----acute kidney injury -on admission creatinine was up to 2.35 in the setting of DKA/volume depletion -Creatinine normalized with hydration - renally adjust medications, avoid nephrotoxic agents / dehydration  / hypotension -Need to hold lisinopril/HCTZ  7)HTN (hypertension) -BP trending up, add amlodipine 10 mg daily Hold lisinopril HCTZ as above #6 -IV hydralazine as needed elevated BP -Increase metoprolol to 50 twice daily  8)SARS-CoV-2 antibody positive - Per patient was recently diagnosed with SARS-CoV-2 as an outpatient -Inpatient screening SARS-CoV-2 positive -Negative influenza A/B -As an outpatient was treated with oral antibiotics, steroids Decadron -Continue molnupiravir -Check x-ray without acute findings, currently on room air  9)Carotid stenosis/left carotid artery stenosis prior carotid endarterectomy in August 2014 and again in 2015, history of prior CVA, - -Continue Lipitor and Plavix for secondary stroke prophylaxis  10)Generalized weakness and deconditioning----PT recommends SNF rehab 08/19/22 -Remains medically stable for discharge to SNF facility when a bed can be found and insurance authorization can be obtained  Status is: Inpatient   Disposition: The patient is from: Home              Anticipated d/c is to: SNF              Anticipated d/c date is: 1 day              Patient currently is medically stable to d/c. Barriers: 08/19/22 -Remains medically stable for discharge to SNF facility when a bed can be  found and insurance authorization can be obtained  Code Status :  -  Code Status: Full Code   Family Communication:    Discussed with  boyfriend  DVT Prophylaxis  :   - SCDs   heparin injection 5,000 Units Start: 08/16/22 1415 Place TED hose Start: 08/16/22 1412 TED hose Start: 08/16/22 1411 SCDs Start: 08/16/22 1407   Lab Results  Component Value Date   PLT 181 08/19/2022   Inpatient Medications  Scheduled Meds:  amLODipine  10 mg Oral Daily   arformoterol  15 mcg Nebulization BID   ascorbic acid  500 mg Oral Daily   atorvastatin  40 mg Oral Daily   budesonide (PULMICORT) nebulizer solution  0.5 mg Nebulization BID   Chlorhexidine Gluconate Cloth  6 each Topical Daily   clopidogrel  75 mg Oral Daily   doxycycline  100 mg Oral Q12H   heparin  5,000 Units Subcutaneous Q8H   insulin aspart  0-20 Units Subcutaneous TID WC   insulin aspart  0-5 Units Subcutaneous QHS   insulin aspart  4 Units Subcutaneous TID WC   insulin glargine-yfgn  20 Units Subcutaneous Daily   ipratropium-albuterol  3 mL Nebulization QID   metoprolol tartrate  50 mg Oral BID   molnupiravir EUA  4 capsule Oral BID   revefenacin  175 mcg Nebulization Daily   sodium bicarbonate  50 mEq Intravenous Once   sodium bicarbonate  650 mg Oral TID   sodium chloride flush  3 mL Intravenous Q12H   sodium chloride flush  3 mL Intravenous Q12H   zinc sulfate  220 mg Oral Daily   Continuous Infusions:  sodium chloride     sodium chloride 10 mL/hr at 08/19/22 1408   PRN Meds:.sodium chloride, acetaminophen **OR** acetaminophen, albuterol, ALPRAZolam, bisacodyl, dextrose, hydrALAZINE, HYDROmorphone (DILAUDID) injection, levalbuterol, ondansetron **OR** ondansetron (ZOFRAN) IV, oxyCODONE, senna-docusate, sodium chloride flush, traZODone   Anti-infectives (From admission, onward)    Start     Dose/Rate Route Frequency Ordered Stop   08/18/22 1300  vancomycin (VANCOCIN) IVPB 1000 mg/200 mL premix  Status:  Discontinued        1,000 mg 200 mL/hr over 60 Minutes Intravenous Every 48 hours 08/16/22 1431 08/17/22 1506   08/18/22 1100  doxycycline  (VIBRA-TABS) tablet 100 mg        100 mg Oral Every 12 hours 08/18/22 1004 08/23/22 0959   08/17/22 1200  ceFEPIme (MAXIPIME) 2 g in sodium chloride 0.9 % 100 mL IVPB  Status:  Discontinued        2 g 200 mL/hr over 30 Minutes Intravenous Every 24 hours 08/16/22 1425 08/18/22 1004   08/16/22 1730  molnupiravir EUA (LAGEVRIO) capsule 800 mg        4 capsule Oral 2 times daily 08/16/22 1543 08/21/22 2159   08/16/22 1545  nirmatrelvir/ritonavir EUA (PAXLOVID) 3 tablet  Status:  Discontinued        3 tablet Oral 2 times daily 08/16/22 1537 08/16/22 1543   08/16/22 1445  vancomycin (VANCOCIN) IVPB 1000 mg/200 mL premix        1,000 mg 200 mL/hr over 60 Minutes Intravenous  Once 08/16/22 1430 08/16/22 1810   08/16/22 1245  vancomycin (VANCOCIN) IVPB 1000 mg/200 mL premix        1,000 mg 200 mL/hr over 60 Minutes Intravenous  Once 08/16/22 1241 08/16/22 1459   08/16/22 1245  ceFEPIme (MAXIPIME) 2 g in sodium chloride 0.9 % 100 mL IVPB  2 g 200 mL/hr over 30 Minutes Intravenous  Once 08/16/22 1241 08/16/22 1440        Subjective: Katrina Stack today has no fevers, no emesis,  No chest pain,   -- 08/19/22 -Remains medically stable for discharge to SNF facility when a bed can be found and insurance authorization can be obtained   Objective: Vitals:   08/19/22 0755 08/19/22 0756 08/19/22 0849 08/19/22 1447  BP:   (!) 155/98 (!) 151/72  Pulse:   92 88  Resp:    15  Temp:    98.7 F (37.1 C)  TempSrc:    Oral  SpO2: 99% 100%  92%  Weight:      Height:        Intake/Output Summary (Last 24 hours) at 08/19/2022 1951 Last data filed at 08/19/2022 1700 Gross per 24 hour  Intake 360 ml  Output 500 ml  Net -140 ml   Filed Weights   08/17/22 0400 08/18/22 0344 08/19/22 0522  Weight: 83 kg 82.7 kg 87.9 kg   Physical Exam  Gen:- Awake Alert,  in no apparent distress  HEENT:- [place].AT, No sclera icterus Neck-Supple Neck,No JVD,.  Lungs-  improved air movement, no wheezing at  this time CV- S1, S2 normal, regular  Abd-  +ve B.Sounds, Abd Soft, No tenderness, increased truncal adiposity Extremity/Skin:- No  edema, pedal pulses present  Psych-patient episodes of confusion and disorientation, as per husband this happens at times  neuro-generalized weakness, no new focal deficits, no tremors  Data Reviewed: I have personally reviewed following labs and imaging studies  CBC: Recent Labs  Lab 08/16/22 1235 08/17/22 0243 08/18/22 0149 08/19/22 0720  WBC 13.1* 10.5 7.6 5.8  NEUTROABS 11.1*  --   --   --   HGB 13.6 11.5* 9.9* 10.4*  HCT 43.0 34.7* 29.0* 31.2*  MCV 93.5 91.1 89.0 89.9  PLT 278 173 153 181   Basic Metabolic Panel: Recent Labs  Lab 08/16/22 1611 08/16/22 1837 08/17/22 0243 08/17/22 0551 08/17/22 1732 08/18/22 0149 08/18/22 0429  NA 134*   < > 140 137 139 139 142  K 4.9   < > 3.8 3.8 3.2* 3.6 3.7  CL 106   < > 113* 111 114* 117* 118*  CO2 <7*   < > 17* 18* 18* 18* 18*  GLUCOSE 388*   < > 234* 213* 246* 139* 124*  BUN 48*   < > 34* 30* 22 19 20   CREATININE 2.04*   < > 1.23* 1.08* 0.92 0.80 0.81  CALCIUM 8.2*   < > 8.4* 8.7* 8.5* 8.2* 8.2*  MG 2.4  --   --   --   --   --   --   PHOS 5.1*  --   --   --  1.7*  --   --    < > = values in this interval not displayed.   GFR: Estimated Creatinine Clearance: 71.4 mL/min (by C-G formula based on SCr of 0.81 mg/dL). Liver Function Tests: Recent Labs  Lab 08/16/22 1235 08/17/22 1732  AST 70*  --   ALT 82*  --   ALKPHOS 88  --   BILITOT 1.3*  --   PROT 7.3  --   ALBUMIN 3.3* 2.5*   Recent Results (from the past 240 hour(s))  Resp panel by RT-PCR (RSV, Flu A&B, Covid) Anterior Nasal Swab     Status: Abnormal   Collection Time: 08/16/22  1:34 PM   Specimen: Anterior Nasal Swab  Result Value Ref Range Status   SARS Coronavirus 2 by RT PCR POSITIVE (A) NEGATIVE Final    Comment: (NOTE) SARS-CoV-2 target nucleic acids are DETECTED.  The SARS-CoV-2 RNA is generally detectable in upper  respiratory specimens during the acute phase of infection. Positive results are indicative of the presence of the identified virus, but do not rule out bacterial infection or co-infection with other pathogens not detected by the test. Clinical correlation with patient history and other diagnostic information is necessary to determine patient infection status. The expected result is Negative.  Fact Sheet for Patients: BloggerCourse.com  Fact Sheet for Healthcare Providers: SeriousBroker.it  This test is not yet approved or cleared by the [place] FDA and  has been authorized for detection and/or diagnosis of SARS-CoV-2 by FDA under an Emergency Use Authorization (EUA).  This EUA will remain in effect (meaning this test can be used) for the duration of  the COVID-19 declaration under Section 564(b)(1) of the A ct, 21 U.S.C. section 360bbb-3(b)(1), unless the authorization is terminated or revoked sooner.     Influenza A by PCR NEGATIVE NEGATIVE Final   Influenza B by PCR NEGATIVE NEGATIVE Final    Comment: (NOTE) The Xpert Xpress SARS-CoV-2/FLU/RSV plus assay is intended as an aid in the diagnosis of influenza from Nasopharyngeal swab specimens and should not be used as a sole basis for treatment. Nasal washings and aspirates are unacceptable for Xpert Xpress SARS-CoV-2/FLU/RSV testing.  Fact Sheet for Patients: BloggerCourse.com  Fact Sheet for Healthcare Providers: SeriousBroker.it  This test is not yet approved or cleared by the [place] FDA and has been authorized for detection and/or diagnosis of SARS-CoV-2 by FDA under an Emergency Use Authorization (EUA). This EUA will remain in effect (meaning this test can be used) for the duration of the COVID-19 declaration under Section 564(b)(1) of the Act, 21 U.S.C. section 360bbb-3(b)(1), unless the authorization is  terminated or revoked.     Resp Syncytial Virus by PCR NEGATIVE NEGATIVE Final    Comment: (NOTE) Fact Sheet for Patients: BloggerCourse.com  Fact Sheet for Healthcare Providers: SeriousBroker.it  This test is not yet approved or cleared by the [place] FDA and has been authorized for detection and/or diagnosis of SARS-CoV-2 by FDA under an Emergency Use Authorization (EUA). This EUA will remain in effect (meaning this test can be used) for the duration of the COVID-19 declaration under Section 564(b)(1) of the Act, 21 U.S.C. section 360bbb-3(b)(1), unless the authorization is terminated or revoked.  Performed at [facility], [address], [place], [place] [address]   Blood Culture (routine x 2)     Status: None (Preliminary result)   Collection Time: 08/16/22  1:43 PM   Specimen: Site Not Specified; Blood  Result Value Ref Range Status   Specimen Description SITE NOT SPECIFIED  Final   Special Requests   Final    BOTTLES DRAWN AEROBIC AND ANAEROBIC Blood Culture adequate volume   Culture   Final    NO GROWTH 3 DAYS Performed at [facility], [address], [place], [place] [address]    Report Status PENDING  Incomplete  MRSA Next Gen by PCR, Nasal     Status: None   Collection Time: 08/16/22  6:00 PM   Specimen: Nasal Mucosa; Nasal Swab  Result Value Ref Range Status   MRSA by PCR Next Gen NOT DETECTED NOT DETECTED Final    Comment: (NOTE) The GeneXpert MRSA Assay (FDA approved for NASAL specimens only), is one component of  a comprehensive MRSA colonization surveillance program. It is not intended to diagnose MRSA infection nor to guide or monitor treatment for MRSA infections. Test performance is not FDA approved in patients less than 23 years old. Performed at [facility], [address] [place], [place] [address]   Urine Culture     Status: Abnormal   Collection Time: 08/16/22  6:40 PM   Specimen: Urine,  Clean Catch  Result Value Ref Range Status   Specimen Description   Final    URINE, CLEAN CATCH Performed at [facility], [address], [place], [place] [address]    Special Requests   Final    NONE Performed at [facility], [address], [place], [place] [address]    Culture (A)  Final    <10,000 COLONIES/mL INSIGNIFICANT GROWTH Performed at [facility], [address], [place], [place] [address]    Report Status 08/17/2022 FINAL  Final    Radiology Studies: No results found.  Scheduled Meds:  amLODipine  10 mg Oral Daily   arformoterol  15 mcg Nebulization BID   ascorbic acid  500 mg Oral Daily   atorvastatin  40 mg Oral Daily   budesonide (PULMICORT) nebulizer solution  0.5 mg Nebulization BID   Chlorhexidine Gluconate Cloth  6 each Topical Daily   clopidogrel  75 mg Oral Daily   doxycycline  100 mg Oral Q12H   heparin  5,000 Units Subcutaneous Q8H   insulin aspart  0-20 Units Subcutaneous TID WC   insulin aspart  0-5 Units Subcutaneous QHS   insulin aspart  4 Units Subcutaneous TID WC   insulin glargine-yfgn  20 Units Subcutaneous Daily   ipratropium-albuterol  3 mL Nebulization QID   metoprolol tartrate  50 mg Oral BID   molnupiravir EUA  4 capsule Oral BID   revefenacin  175 mcg Nebulization Daily   sodium bicarbonate  50 mEq Intravenous Once   sodium bicarbonate  650 mg Oral TID   sodium chloride flush  3 mL Intravenous Q12H   sodium chloride flush  3 mL Intravenous Q12H   zinc sulfate  220 mg Oral Daily   Continuous Infusions:  sodium chloride     sodium chloride 10 mL/hr at 08/19/22 1408    LOS: 3 days   Shon Hale M.D on 08/19/2022 at 7:51 PM  Go to [email or web address] - for contact info  [facility] - Office  [phone]  If 7PM-7AM, please contact night-coverage [email or web address] 08/19/2022, 7:51 PM

## 2022-08-19 NOTE — TOC Progression Note (Signed)
Transition of Care [facility]) - Progression Note    Patient Details  Name: Leah Wolf MRN: [id number] Date of Birth: April 09, 1956  [facility]) CM/SW Contact  Leitha Bleak, RN Phone Number: 08/19/2022, 3:39 PM  Clinical Narrative:   TOC consulted to discuss and explain SNF with Roe Coombs. CM called, he is at the bedside, states he can not take her home until she gets better. He wants a SNF close to home.  FL2 completed and sent out to [place], [place] and faxed to [employer]. Roe Coombs want to discuss bed offer and ratings on Monday.    Expected Discharge Plan: Skilled Nursing Facility Barriers to Discharge: Continued Medical Work up  Expected Discharge Plan and Services Expected Discharge Plan: Skilled Nursing Facility       Living arrangements for the past 2 months: Single Family Home                             HH Agency: [facility] Date HH Agency Contacted: 08/18/22 Time HH Agency Contacted: 1316 Representative spoke with at [facility] Agency: Emailing orders and records   Readmission Risk Interventions    08/17/2022    1:14 PM  Readmission Risk Prevention Plan  Transportation Screening Complete  PCP or Specialist Appt within 3-5 Days Not Complete  HRI or Home Care Consult Complete  Social Work Consult for Recovery Care Planning/Counseling Complete  Palliative Care Screening Not Applicable  Medication Review [employer]) Complete

## 2022-08-19 NOTE — Inpatient Diabetes Management (Signed)
Inpatient Diabetes Program Recommendations  AACE/ADA: New Consensus Statement on Inpatient Glycemic Control   Target Ranges:  Prepandial:   less than 140 mg/dL      Peak postprandial:   less than 180 mg/dL (1-2 hours)      Critically ill patients:  140 - 180 mg/dL    Latest Reference Range & Units 08/18/22 13:59 08/18/22 17:04 08/18/22 21:55 08/19/22 07:41 08/19/22 11:55  Glucose-Capillary 70 - 99 mg/dL 133 (H) 102 (H) 156 (H) 163 (H) 250 (H)   Review of Glycemic Control  Diabetes history: DM2 (recently new dx of DM per H&P) Outpatient Diabetes medications: None listed on med list; per H&P, discharged from [facility] on insulin 1 week ago; Decadron 4 mg BID Current orders for Inpatient glycemic control:  Semglee 20 units Daily Novolog 0-20 units tid + hs Novolog 4 units tid meal coverage  Inpatient Diabetes Program Recommendations:    Note pt not meeting parameters to receive Novolog meal coverage. However, glucose trends seem to increase after meal times.  Watch trends for now.  HbgA1C: 14.3% on 12/13  Outpatient DM: At time of discharge, please provide Rx for glucose monitoring kit (#[id number]), insulin pens, and insulin pen needles.   Thanks,  Tama Headings RN, MSN, BC-ADM Inpatient Diabetes Coordinator Team Pager [phone] (8a-5p)

## 2022-08-19 NOTE — [place] FL2 (Signed)
[place] MEDICAID FL2 LEVEL OF CARE FORM     IDENTIFICATION  Patient Name: Leah Wolf Birthdate: 1956-06-16 Sex: female Admission Date (Current Location): 08/16/2022  [facility] and [place] Number:  [employer] and Address:  [facility],  [address], [place] [address]      Provider Number: [id number]  Attending Physician Name and Address:  Shon Hale, MD  Relative Name and Phone Number:  Serita Grit  [phone]    Current Level of Care: Hospital Recommended Level of Care: Skilled Nursing Facility Prior Approval Number:    Date Approved/Denied:   PASRR Number:    Discharge Plan: SNF    Current Diagnoses: Patient Active Problem List   Diagnosis Date Noted   DKA (diabetic ketoacidosis) (HCC) 08/16/2022   HTN (hypertension) 08/16/2022   HLD (hyperlipidemia) 08/16/2022   Hyperkalemia 08/16/2022   Acute renal insufficiency 08/16/2022   SARS-CoV-2 antibody positive 08/16/2022   Pseudohyponatremia 08/16/2022   DMII (diabetes mellitus, type 2) (HCC) 08/16/2022   Acute respiratory distress 08/16/2022   Carotid stenosis 01/05/2014   Occlusion and stenosis of carotid artery without mention of cerebral infarction 04/07/2013    Orientation RESPIRATION BLADDER Height & Weight     Self  Normal External catheter Weight: 87.9 kg Height:  5\' 4"  (162.6 cm)  BEHAVIORAL SYMPTOMS/MOOD NEUROLOGICAL BOWEL NUTRITION STATUS      Continent Diet (See DC Summary)  AMBULATORY STATUS COMMUNICATION OF NEEDS Skin   Extensive Assist Verbally Bruising                       Personal Care Assistance Level of Assistance  Bathing, Feeding, Dressing Bathing Assistance: Maximum assistance Feeding assistance: Limited assistance Dressing Assistance: Maximum assistance     Functional Limitations Info  Sight, Hearing, Speech Sight Info: Adequate Hearing Info: Adequate Speech Info: Adequate    SPECIAL CARE FACTORS FREQUENCY  PT (By licensed PT)      PT Frequency: 5 times a week              Contractures Contractures Info: Not present    Additional Factors Info  Code Status, Allergies Code Status Info: FULL Allergies Info: percocet, morphine           Current Medications (08/19/2022):  This is the current hospital active medication list Current Facility-Administered Medications  Medication Dose Route Frequency Provider Last Rate Last Admin   0.9 %  sodium chloride infusion  250 mL Intravenous PRN Shahmehdi, Seyed A, MD       0.9 %  sodium chloride infusion   Intravenous Continuous Emokpae, Courage, MD 10 mL/hr at 08/19/22 1408 Rate Change at 08/19/22 1408   acetaminophen (TYLENOL) tablet 650 mg  650 mg Oral Q6H PRN Shahmehdi, 08/21/22, MD       Or   acetaminophen (TYLENOL) suppository 650 mg  650 mg Rectal Q6H PRN Shahmehdi, Seyed A, MD       albuterol (PROVENTIL) (2.5 MG/3ML) 0.083% nebulizer solution 2.5 mg  2.5 mg Nebulization Q4H PRN Gemma Payor, MD       ALPRAZolam Oretha Milch) tablet 1 mg  1 mg Oral TID PRN Prudy Feeler A, MD   1 mg at 08/19/22 1258   amLODipine (NORVASC) tablet 10 mg  10 mg Oral Daily Emokpae, Courage, MD   10 mg at 08/19/22 0849   arformoterol (BROVANA) nebulizer solution 15 mcg  15 mcg Nebulization BID 08/21/22, MD   15 mcg at 08/19/22 [phone]  ascorbic acid (VITAMIN C) tablet 500 mg  500 mg Oral Daily Shahmehdi, Seyed A, MD   500 mg at 08/19/22 0850   atorvastatin (LIPITOR) tablet 40 mg  40 mg Oral Daily Shahmehdi, Seyed A, MD   40 mg at 08/19/22 0849   bisacodyl (DULCOLAX) EC tablet 5 mg  5 mg Oral Daily PRN Nevin Bloodgood A, MD       budesonide (PULMICORT) nebulizer solution 0.5 mg  0.5 mg Nebulization BID Oretha Milch, MD   0.5 mg at 08/19/22 0755   Chlorhexidine Gluconate Cloth 2 % PADS 6 each  6 each Topical Daily Nevin Bloodgood A, MD   6 each at 08/19/22 0900   clopidogrel (PLAVIX) tablet 75 mg  75 mg Oral Daily Shahmehdi, Seyed A, MD   75 mg at 08/19/22 0850   dextrose 50 %  solution 0-50 mL  0-50 mL Intravenous PRN Shahmehdi, Gemma Payor, MD       doxycycline (VIBRA-TABS) tablet 100 mg  100 mg Oral Q12H Emokpae, Courage, MD   100 mg at 08/19/22 0849   heparin injection 5,000 Units  5,000 Units Subcutaneous Q8H Shahmehdi, Seyed A, MD   5,000 Units at 08/19/22 1258   hydrALAZINE (APRESOLINE) injection 10 mg  10 mg Intravenous Q6H PRN Shon Hale, MD   10 mg at 08/18/22 0829   HYDROmorphone (DILAUDID) injection 0.5-1 mg  0.5-1 mg Intravenous Q2H PRN Shahmehdi, Seyed A, MD       insulin aspart (novoLOG) injection 0-20 Units  0-20 Units Subcutaneous TID WC Emokpae, Courage, MD   7 Units at 08/19/22 1259   insulin aspart (novoLOG) injection 0-5 Units  0-5 Units Subcutaneous QHS Emokpae, Courage, MD       insulin aspart (novoLOG) injection 4 Units  4 Units Subcutaneous TID WC Emokpae, Courage, MD       insulin glargine-yfgn (SEMGLEE) injection 20 Units  20 Units Subcutaneous Daily Emokpae, Courage, MD   20 Units at 08/19/22 0853   ipratropium-albuterol (DUONEB) 0.5-2.5 (3) MG/3ML nebulizer solution 3 mL  3 mL Nebulization QID Emokpae, Courage, MD   3 mL at 08/19/22 1446   levalbuterol (XOPENEX) nebulizer solution 0.63 mg  0.63 mg Nebulization Q6H PRN Oretha Milch, MD       metoprolol tartrate (LOPRESSOR) tablet 50 mg  50 mg Oral BID Mariea Clonts, Courage, MD   50 mg at 08/19/22 0850   molnupiravir EUA (LAGEVRIO) capsule 800 mg  4 capsule Oral BID Shahmehdi, Seyed A, MD   800 mg at 08/18/22 1003   ondansetron (ZOFRAN) tablet 4 mg  4 mg Oral Q6H PRN Shahmehdi, Seyed A, MD       Or   ondansetron (ZOFRAN) injection 4 mg  4 mg Intravenous Q6H PRN Shahmehdi, Seyed A, MD       oxyCODONE (Oxy IR/ROXICODONE) immediate release tablet 5 mg  5 mg Oral Q4H PRN Shahmehdi, Seyed A, MD       revefenacin (YUPELRI) nebulizer solution 175 mcg  175 mcg Nebulization Daily Cyril Mourning V, MD   175 mcg at 08/19/22 0754   senna-docusate (Senokot-S) tablet 1 tablet  1 tablet Oral QHS PRN Shahmehdi,  Seyed A, MD       sodium bicarbonate injection 50 mEq  50 mEq Intravenous Once Shahmehdi, Seyed A, MD       sodium bicarbonate tablet 650 mg  650 mg Oral TID Shon Hale, MD   650 mg at 08/19/22 0849   sodium chloride flush (NS) 0.9 % injection  3 mL  3 mL Intravenous Q12H Shahmehdi, Seyed A, MD   3 mL at 08/19/22 0907   sodium chloride flush (NS) 0.9 % injection 3 mL  3 mL Intravenous Q12H Shahmehdi, Seyed A, MD   3 mL at 08/19/22 0907   sodium chloride flush (NS) 0.9 % injection 3 mL  3 mL Intravenous PRN Shahmehdi, Seyed A, MD       traZODone (DESYREL) tablet 25 mg  25 mg Oral QHS PRN Shahmehdi, Seyed A, MD   25 mg at 08/17/22 2125   zinc sulfate capsule 220 mg  220 mg Oral Daily Nevin Bloodgood A, MD   220 mg at 08/19/22 4765     Discharge Medications: Please see discharge summary for a list of discharge medications.  Relevant Imaging Results:  Relevant Lab Results:   Additional Information SS# [id number]  Leitha Bleak, RN

## 2022-08-20 DIAGNOSIS — E1110 Type 2 diabetes mellitus with ketoacidosis without coma: Secondary | ICD-10-CM | POA: Diagnosis not present

## 2022-08-20 LAB — CBC
HCT: 30.4 % — ABNORMAL LOW (ref 36.0–46.0)
Hemoglobin: 10 g/dL — ABNORMAL LOW (ref 12.0–15.0)
MCH: 30 pg (ref 26.0–34.0)
MCHC: 32.9 g/dL (ref 30.0–36.0)
MCV: 91.3 fL (ref 80.0–100.0)
Platelets: 190 10*3/uL (ref 150–400)
RBC: 3.33 MIL/uL — ABNORMAL LOW (ref 3.87–5.11)
RDW: 15.9 % — ABNORMAL HIGH (ref 11.5–15.5)
WBC: 5.4 10*3/uL (ref 4.0–10.5)
nRBC: 0.7 % — ABNORMAL HIGH (ref 0.0–0.2)

## 2022-08-20 LAB — GLUCOSE, CAPILLARY
Glucose-Capillary: 142 mg/dL — ABNORMAL HIGH (ref 70–99)
Glucose-Capillary: 160 mg/dL — ABNORMAL HIGH (ref 70–99)
Glucose-Capillary: 141 mg/dL — ABNORMAL HIGH (ref 70–99)
Glucose-Capillary: 104 mg/dL — ABNORMAL HIGH (ref 70–99)

## 2022-08-20 MED ORDER — IPRATROPIUM-ALBUTEROL 0.5-2.5 (3) MG/3ML IN SOLN
3.0000 mL | Freq: Four times a day (QID) | RESPIRATORY_TRACT | Status: DC
  Administered 2022-08-20 – 2022-08-21 (×4): 3 mL via RESPIRATORY_TRACT
  Filled 2022-08-20 (×4): qty 3

## 2022-08-20 NOTE — TOC Progression Note (Signed)
Transition of Care [facility]) - Progression Note    Patient Details  Name: Leah Wolf MRN: [id number] Date of Birth: 26-Jun-1956  Transition of [facility]) CM/SW Contact  Karn Cassis, [place] Phone Number: 08/20/2022, 11:53 AM  Clinical Narrative:  LCSW followed up with SNF facilities and pt's friend, Roe Coombs. Roe Coombs is only interested in [facility].  [facility]: left voicemail for admissions  [facility]: Admissions will review Monday  [facility]: Admissions will review Monday  [facility]: Unable to reach anyone at facility  MD updated. TOC will follow up in AM.     Expected Discharge Plan: Skilled Nursing Facility Barriers to Discharge: No SNF bed  Expected Discharge Plan and Services Expected Discharge Plan: Skilled Nursing Facility       Living arrangements for the past 2 months: Single Family Home                             HH Agency: [facility] Date HH Agency Contacted: 08/18/22 Time HH Agency Contacted: 1316 Representative spoke with at [facility] Agency: Emailing orders and records   Social Determinants of Health (SDOH) Interventions    Readmission Risk Interventions    08/17/2022    1:14 PM  Readmission Risk Prevention Plan  Transportation Screening Complete  PCP or Specialist Appt within 3-5 Days Not Complete  HRI or Home Care Consult Complete  Social Work Consult for Recovery Care Planning/Counseling Complete  Palliative Care Screening Not Applicable  Medication Review [employer]) Complete

## 2022-08-20 NOTE — Progress Notes (Signed)
Patient has rested well this shift. Patient is speaking, is alert and oriented x 2.  Patient still needs help with adls, but was able to hold cup of soda and drink it without assistance last night. Vitals have been stable. Patient was still too weak to get out  of bed last night.  Patient did have bowel movement last night.

## 2022-08-20 NOTE — Progress Notes (Signed)
Patient repositioned in bed during shift for comfort. Patient alert with confusion, noted patient had some anxiety during shift PRN given, see MAR. Patient continues with decreased appetite. Patient continues to have increased weakness unable to get out of bed.

## 2022-08-20 NOTE — Progress Notes (Signed)
PROGRESS NOTE     Leah Wolf, is a 66 y.o. female, DOB - [birth year], BJS:[id number]  Admit date - 08/16/2022   Admitting Physician [name], MD  Outpatient Primary MD for the patient is [name], MD  LOS - 4  Chief Complaint  Patient presents with   Hyperglycemia   Shortness of Breath        Brief Narrative:   66 year old female with past medical history relevant for DM2, HLD, HTN, obesity, and left carotid artery stenosis prior carotid endarterectomy in August 2014 and again in 2015, history of prior CVA, anxiety disorder and history of medication noncompliance admitted on 08/16/2022 with DKA in the setting of steroid therapy for COVID infection  08/20/22 -Remains medically stable for discharge to SNF facility when a bed can be found and insurance authorization can be obtained --Boyfriend states that he is unable to take off patient at home by himself in her current state... He is agreeable to SNF rehab as recommended by physical therapist   -Assessment and Plan: 1)DKA (diabetic ketoacidosis) ([place]) -DKA--patient met DKA criteria on admission with a bicarb of <7, anion gap of 30, serum glucose of 663 and beta hydroxybutyric acid of >8.0 -Treated with aggressive IV fluids and IV insulin per Endo tool protocol -DKA pathophysiology is resolved -Beta- hydroxybutyric acid is down to 0.8 -Transition off IV insulin to subcu insulin  2)Acute respiratory distress--in the setting of COVID-19 infection/COPD exacerbation -Respiratory status and work of breathing improving with improvement in metabolic acidosis and BiPAP use -In retrospect given severe metabolic acidosis it is not surprising the patient was very tachypneic on admission she was most likely trying to blow off CO2 -Chest x-ray without acute findings -Hold off on steroids given glycemic control on DKA concerns -Pulmonary critical care consult appreciated =-Patient was treated with  Paxlovid -Initially diagnosed with COVID-19 infection over a week ago at [facility] hospital  3)Hyperkalemia -On admission  7.2 -Patient received hyperkalemia cocktail -Potassium normalized with improved glycemic control  4)DMII (diabetes mellitus, type 2) ([place]) - Noncompliant diabetic type II (as per her partner pt had not taken her medications PTA) -- A1c is 14.3 reflecting uncontrolled diabetes with hyperglycemia PTA -Continue scheduled Lantus insulin Use Novolog/Humalog Sliding scale insulin with Accu-Cheks/Fingersticks as ordered   5)Pseudohyponatremia -Sodium normalized with improved glycemic control  6)AKI----acute kidney injury -on admission creatinine was up to 2.35 in the setting of DKA/volume depletion -Creatinine normalized with hydration - renally adjust medications, avoid nephrotoxic agents / dehydration  / hypotension -Need to hold lisinopril/HCTZ  7)HTN (hypertension) -BP trending up, add amlodipine 10 mg daily Hold lisinopril HCTZ as above #6 -IV hydralazine as needed elevated BP -Increase metoprolol to 50 twice daily  8)SARS-CoV-2 antibody positive - Per patient was recently diagnosed with SARS-CoV-2 as an outpatient -Inpatient screening SARS-CoV-2 positive -Negative influenza A/B -As an outpatient was treated with oral antibiotics, steroids Decadron -Continue molnupiravir -Check x-ray without acute findings, currently on room air  9)Carotid stenosis/left carotid artery stenosis prior carotid endarterectomy in August 2014 and again in 2015, history of prior CVA, - -Continue Lipitor and Plavix for secondary stroke prophylaxis  10)Generalized weakness and deconditioning----PT recommends SNF rehab 08/20/22 -Remains medically stable for discharge to SNF facility when a bed can be found and insurance authorization can be obtained  Status is: Inpatient   Disposition: The patient is from: Home              Anticipated d/c is to: SNF  Anticipated d/c  date is: 1 day              Patient currently is medically stable to d/c. Barriers: 08/20/22 -Remains medically stable for discharge to SNF facility when a bed can be found and insurance authorization can be obtained  Code Status :  -  Code Status: Full Code   Family Communication:    Discussed with boyfriend  DVT Prophylaxis  :   - SCDs   heparin injection 5,000 Units Start: 08/16/22 1415 Place TED hose Start: 08/16/22 1412 TED hose Start: 08/16/22 1411 SCDs Start: 08/16/22 1407   Lab Results  Component Value Date   PLT 190 08/20/2022   Inpatient Medications  Scheduled Meds:  amLODipine  10 mg Oral Daily   arformoterol  15 mcg Nebulization BID   ascorbic acid  500 mg Oral Daily   atorvastatin  40 mg Oral Daily   budesonide (PULMICORT) nebulizer solution  0.5 mg Nebulization BID   Chlorhexidine Gluconate Cloth  6 each Topical Daily   clopidogrel  75 mg Oral Daily   doxycycline  100 mg Oral Q12H   heparin  5,000 Units Subcutaneous Q8H   insulin aspart  0-20 Units Subcutaneous TID WC   insulin aspart  0-5 Units Subcutaneous QHS   insulin aspart  4 Units Subcutaneous TID WC   insulin glargine-yfgn  20 Units Subcutaneous Daily   ipratropium-albuterol  3 mL Nebulization Q6H   metoprolol tartrate  50 mg Oral BID   revefenacin  175 mcg Nebulization Daily   sodium bicarbonate  50 mEq Intravenous Once   sodium bicarbonate  650 mg Oral TID   sodium chloride flush  3 mL Intravenous Q12H   sodium chloride flush  3 mL Intravenous Q12H   zinc sulfate  220 mg Oral Daily   Continuous Infusions:  sodium chloride     sodium chloride 10 mL/hr at 08/19/22 1408   PRN Meds:.sodium chloride, acetaminophen **OR** acetaminophen, albuterol, ALPRAZolam, bisacodyl, dextrose, hydrALAZINE, HYDROmorphone (DILAUDID) injection, levalbuterol, ondansetron **OR** ondansetron (ZOFRAN) IV, oxyCODONE, senna-docusate, sodium chloride flush, traZODone   Anti-infectives (From admission, onward)    Start      Dose/Rate Route Frequency Ordered Stop   08/18/22 1300  vancomycin (VANCOCIN) IVPB 1000 mg/200 mL premix  Status:  Discontinued        1,000 mg 200 mL/hr over 60 Minutes Intravenous Every 48 hours 08/16/22 1431 08/17/22 1506   08/18/22 1100  doxycycline (VIBRA-TABS) tablet 100 mg        100 mg Oral Every 12 hours 08/18/22 1004 08/23/22 0959   08/17/22 1200  ceFEPIme (MAXIPIME) 2 g in sodium chloride 0.9 % 100 mL IVPB  Status:  Discontinued        2 g 200 mL/hr over 30 Minutes Intravenous Every 24 hours 08/16/22 1425 08/18/22 1004   08/16/22 1730  molnupiravir EUA (LAGEVRIO) capsule 800 mg  Status:  Discontinued        4 capsule Oral 2 times daily 08/16/22 1543 08/20/22 1226   08/16/22 1545  nirmatrelvir/ritonavir EUA (PAXLOVID) 3 tablet  Status:  Discontinued        3 tablet Oral 2 times daily 08/16/22 1537 08/16/22 1543   08/16/22 1445  vancomycin (VANCOCIN) IVPB 1000 mg/200 mL premix        1,000 mg 200 mL/hr over 60 Minutes Intravenous  Once 08/16/22 1430 08/16/22 1810   08/16/22 1245  vancomycin (VANCOCIN) IVPB 1000 mg/200 mL premix        1,000  mg 200 mL/hr over 60 Minutes Intravenous  Once 08/16/22 1241 08/16/22 1459   08/16/22 1245  ceFEPIme (MAXIPIME) 2 g in sodium chloride 0.9 % 100 mL IVPB        2 g 200 mL/hr over 30 Minutes Intravenous  Once 08/16/22 1241 08/16/22 1440       Subjective: Leah Wolf today has no fevers, no emesis,  No chest pain,   -- 08/20/22 -Remains medically stable for discharge to SNF facility when a bed can be found and insurance authorization can be obtained  -Boyfriend states that he is unable to take off patient at home by himself in her current state... He is agreeable to SNF rehab as recommended by physical therapist  Objective: Vitals:   08/20/22 0840 08/20/22 0937 08/20/22 1430 08/20/22 1431  BP:  (!) 149/65 (!) 113/92   Pulse:  93 84   Resp:   14   Temp:   99.2 F (37.3 C)   TempSrc:   Axillary   SpO2: 98%  94% 94%  Weight:       Height:        Intake/Output Summary (Last 24 hours) at 08/20/2022 1803 Last data filed at 08/20/2022 0421 Gross per 24 hour  Intake --  Output 100 ml  Net -100 ml   Filed Weights   08/18/22 0344 08/19/22 0522 08/20/22 0500  Weight: 82.7 kg 87.9 kg 90.2 kg   Physical Exam  Gen:- Awake Alert,  in no apparent distress , cooperative HEENT:- .AT, No sclera icterus Neck-Supple Neck,No JVD,.  Lungs-  improved air movement, no wheezing at this time CV- S1, S2 normal, regular  Abd-  +ve B.Sounds, Abd Soft, No tenderness, increased truncal adiposity Extremity/Skin:- No  edema, pedal pulses present  Psych-patient episodes of confusion and disorientation, as per her boyfriend this happens at times  neuro-generalized weakness, no new focal deficits, no tremors  Data Reviewed: I have personally reviewed following labs and imaging studies  CBC: Recent Labs  Lab 08/16/22 1235 08/17/22 0243 08/18/22 0149 08/19/22 0720 08/20/22 0409  WBC 13.1* 10.5 7.6 5.8 5.4  NEUTROABS 11.1*  --   --   --   --   HGB 13.6 11.5* 9.9* 10.4* 10.0*  HCT 43.0 34.7* 29.0* 31.2* 30.4*  MCV 93.5 91.1 89.0 89.9 91.3  PLT 278 173 153 181 916   Basic Metabolic Panel: Recent Labs  Lab 08/16/22 1611 08/16/22 1837 08/17/22 0243 08/17/22 0551 08/17/22 1732 08/18/22 0149 08/18/22 0429  NA 134*   < > 140 137 139 139 142  K 4.9   < > 3.8 3.8 3.2* 3.6 3.7  CL 106   < > 113* 111 114* 117* 118*  CO2 <7*   < > 17* 18* 18* 18* 18*  GLUCOSE 388*   < > 234* 213* 246* 139* 124*  BUN 48*   < > 34* 30* _0 CREATININE 2.04*   < > 1.23* 1.08* 0.92 0.80 0.81  CALCIUM 8.2*   < > 8.4* 8.7* 8.5* 8.2* 8.2*  MG 2.4  --   --   --   --   --   --   PHOS 5.1*  --   --   --  1.7*  --   --    < > = values in this interval not displayed.   GFR: Estimated Creatinine Clearance: 72.3 mL/min (by C-G formula based on SCr of 0.81 mg/dL). Liver Function Tests: Recent Labs  Lab 08/16/22 1235 08/17/22  1732  AST 70*  --    ALT 82*  --   ALKPHOS 88  --   BILITOT 1.3*  --   PROT 7.3  --   ALBUMIN 3.3* 2.5*   Recent Results (from the past 240 hour(s))  Resp panel by RT-PCR (RSV, Flu A&B, Covid) Anterior Nasal Swab     Status: Abnormal   Collection Time: 08/16/22  1:34 PM   Specimen: Anterior Nasal Swab  Result Value Ref Range Status   SARS Coronavirus 2 by RT PCR POSITIVE (A) NEGATIVE Final    Comment: (NOTE) SARS-CoV-2 target nucleic acids are DETECTED.  The SARS-CoV-2 RNA is generally detectable in upper respiratory specimens during the acute phase of infection. Positive results are indicative of the presence of the identified virus, but do not rule out bacterial infection or co-infection with other pathogens not detected by the test. Clinical correlation with patient history and other diagnostic information is necessary to determine patient infection status. The expected result is Negative.  Fact Sheet for Patients: EntrepreneurPulse.com.au  Fact Sheet for Healthcare Providers: IncredibleEmployment.be  This test is not yet approved or cleared by the [place] FDA and  has been authorized for detection and/or diagnosis of SARS-CoV-2 by FDA under an Emergency Use Authorization (EUA).  This EUA will remain in effect (meaning this test can be used) for the duration of  the COVID-19 declaration under Section 564(b)(1) of the A ct, 21 U.S.C. section 360bbb-3(b)(1), unless the authorization is terminated or revoked sooner.     Influenza A by PCR NEGATIVE NEGATIVE Final   Influenza B by PCR NEGATIVE NEGATIVE Final    Comment: (NOTE) The Xpert Xpress SARS-CoV-2/FLU/RSV plus assay is intended as an aid in the diagnosis of influenza from Nasopharyngeal swab specimens and should not be used as a sole basis for treatment. Nasal washings and aspirates are unacceptable for Xpert Xpress SARS-CoV-2/FLU/RSV testing.  Fact Sheet for  Patients: EntrepreneurPulse.com.au  Fact Sheet for Healthcare Providers: IncredibleEmployment.be  This test is not yet approved or cleared by the [place] FDA and has been authorized for detection and/or diagnosis of SARS-CoV-2 by FDA under an Emergency Use Authorization (EUA). This EUA will remain in effect (meaning this test can be used) for the duration of the COVID-19 declaration under Section 564(b)(1) of the Act, 21 U.S.C. section 360bbb-3(b)(1), unless the authorization is terminated or revoked.     Resp Syncytial Virus by PCR NEGATIVE NEGATIVE Final    Comment: (NOTE) Fact Sheet for Patients: EntrepreneurPulse.com.au  Fact Sheet for Healthcare Providers: IncredibleEmployment.be  This test is not yet approved or cleared by the [place] FDA and has been authorized for detection and/or diagnosis of SARS-CoV-2 by FDA under an Emergency Use Authorization (EUA). This EUA will remain in effect (meaning this test can be used) for the duration of the COVID-19 declaration under Section 564(b)(1) of the Act, 21 U.S.C. section 360bbb-3(b)(1), unless the authorization is terminated or revoked.  Performed at [facility], [address] [place], [place] [address]   Blood Culture (routine x 2)     Status: None (Preliminary result)   Collection Time: 08/16/22  1:43 PM   Specimen: Site Not Specified; Blood  Result Value Ref Range Status   Specimen Description SITE NOT SPECIFIED  Final   Special Requests   Final    BOTTLES DRAWN AEROBIC AND ANAEROBIC Blood Culture adequate volume   Culture   Final    NO GROWTH 4 DAYS Performed at [facility], [place]  [address] [place], [place] [address]    Report Status PENDING  Incomplete  MRSA Next Gen by PCR, Nasal     Status: None   Collection Time: 08/16/22  6:00 PM   Specimen: Nasal Mucosa; Nasal Swab  Result Value Ref Range Status   MRSA by PCR Next Gen NOT  DETECTED NOT DETECTED Final    Comment: (NOTE) The GeneXpert MRSA Assay (FDA approved for NASAL specimens only), is one component of a comprehensive MRSA colonization surveillance program. It is not intended to diagnose MRSA infection nor to guide or monitor treatment for MRSA infections. Test performance is not FDA approved in patients less than 68 years old. Performed at [facility], [address], [place], [place] [address]   Urine Culture     Status: Abnormal   Collection Time: 08/16/22  6:40 PM   Specimen: Urine, Clean Catch  Result Value Ref Range Status   Specimen Description   Final    URINE, CLEAN CATCH Performed at [facility], [address], [place], [place] [address]    Special Requests   Final    NONE Performed at [facility], [address] [place], [place] [address]    Culture (A)  Final    <10,000 COLONIES/mL INSIGNIFICANT GROWTH Performed at [facility], [place] [address], [place], [place] [address]    Report Status 08/17/2022 FINAL  Final    Radiology Studies: No results found.  Scheduled Meds:  amLODipine  10 mg Oral Daily   arformoterol  15 mcg Nebulization BID   ascorbic acid  500 mg Oral Daily   atorvastatin  40 mg Oral Daily   budesonide (PULMICORT) nebulizer solution  0.5 mg Nebulization BID   Chlorhexidine Gluconate Cloth  6 each Topical Daily   clopidogrel  75 mg Oral Daily   doxycycline  100 mg Oral Q12H   heparin  5,000 Units Subcutaneous Q8H   insulin aspart  0-20 Units Subcutaneous TID WC   insulin aspart  0-5 Units Subcutaneous QHS   insulin aspart  4 Units Subcutaneous TID WC   insulin glargine-yfgn  20 Units Subcutaneous Daily   ipratropium-albuterol  3 mL Nebulization Q6H   metoprolol tartrate  50 mg Oral BID   revefenacin  175 mcg Nebulization Daily   sodium bicarbonate  50 mEq Intravenous Once   sodium bicarbonate  650 mg Oral TID   sodium chloride flush  3 mL Intravenous Q12H   sodium chloride flush  3 mL Intravenous Q12H    zinc sulfate  220 mg Oral Daily   Continuous Infusions:  sodium chloride     sodium chloride 10 mL/hr at 08/19/22 1408    LOS: 4 days   Roxan Hockey M.D on 08/20/2022 at 6:03 PM  Go to [email or web address] - for contact info  [facility] - Office  [phone]  If 7PM-7AM, please contact night-coverage [email or web address] 08/20/2022, 6:03 PM

## 2022-08-21 DIAGNOSIS — R768 Other specified abnormal immunological findings in serum: Secondary | ICD-10-CM | POA: Diagnosis not present

## 2022-08-21 DIAGNOSIS — I10 Essential (primary) hypertension: Secondary | ICD-10-CM | POA: Diagnosis not present

## 2022-08-21 DIAGNOSIS — E0810 Diabetes mellitus due to underlying condition with ketoacidosis without coma: Secondary | ICD-10-CM | POA: Diagnosis not present

## 2022-08-21 LAB — BASIC METABOLIC PANEL
Anion gap: 7 (ref 5–15)
BUN: 12 mg/dL (ref 8–23)
CO2: 22 mmol/L (ref 22–32)
Calcium: 8 mg/dL — ABNORMAL LOW (ref 8.9–10.3)
Chloride: 116 mmol/L — ABNORMAL HIGH (ref 98–111)
Creatinine, Ser: 0.7 mg/dL (ref 0.44–1.00)
GFR, Estimated: >60 mL/min (ref >60)
Glucose, Bld: 105 mg/dL — ABNORMAL HIGH (ref 70–99)
Potassium: 2.9 mmol/L — ABNORMAL LOW (ref 3.5–5.1)
Sodium: 145 mmol/L (ref 135–145)

## 2022-08-21 LAB — CBC
HCT: 29.8 % — ABNORMAL LOW (ref 36.0–46.0)
Hemoglobin: 9.9 g/dL — ABNORMAL LOW (ref 12.0–15.0)
MCH: 30.4 pg (ref 26.0–34.0)
MCHC: 33.2 g/dL (ref 30.0–36.0)
MCV: 91.4 fL (ref 80.0–100.0)
Platelets: 198 10*3/uL (ref 150–400)
RBC: 3.26 MIL/uL — ABNORMAL LOW (ref 3.87–5.11)
RDW: 16.2 % — ABNORMAL HIGH (ref 11.5–15.5)
WBC: 5.7 10*3/uL (ref 4.0–10.5)
nRBC: 0.4 % — ABNORMAL HIGH (ref 0.0–0.2)

## 2022-08-21 LAB — GLUCOSE, CAPILLARY
Glucose-Capillary: 113 mg/dL — ABNORMAL HIGH (ref 70–99)
Glucose-Capillary: 168 mg/dL — ABNORMAL HIGH (ref 70–99)

## 2022-08-21 LAB — CULTURE, BLOOD (ROUTINE X 2)
Culture: NO GROWTH
Special Requests: ADEQUATE

## 2022-08-21 LAB — MAGNESIUM: Magnesium: 1.9 mg/dL (ref 1.7–2.4)

## 2022-08-21 MED ORDER — LORAZEPAM 1 MG PO TABS: 1.0000 mg | ORAL_TABLET | Freq: Two times a day (BID) | ORAL | 0 refills | Status: AC

## 2022-08-21 MED ORDER — ZINC SULFATE 220 (50 ZN) MG PO CAPS: 220.0000 mg | ORAL_CAPSULE | Freq: Every day | ORAL | 2 refills | Status: AC

## 2022-08-21 MED ORDER — SENNOSIDES-DOCUSATE SODIUM 8.6-50 MG PO TABS: 2.0000 | ORAL_TABLET | Freq: Every day | ORAL | 1 refills | Status: AC

## 2022-08-21 MED ORDER — INSULIN GLARGINE-YFGN 100 UNIT/ML ~~LOC~~ SOPN: 15.0000 [IU] | PEN_INJECTOR | Freq: Every day | SUBCUTANEOUS | 2 refills | Status: AC

## 2022-08-21 MED ORDER — INSULIN GLARGINE-YFGN 100 UNIT/ML ~~LOC~~ SOLN: 15.0000 [IU] | Freq: Every day | SUBCUTANEOUS | 11 refills | Status: DC

## 2022-08-21 MED ORDER — ASCORBIC ACID 500 MG PO TABS: 500.0000 mg | ORAL_TABLET | Freq: Every day | ORAL | 1 refills | Status: AC

## 2022-08-21 MED ORDER — POTASSIUM CHLORIDE 10 MEQ/100ML IV SOLN
10.0000 meq | INTRAVENOUS | Status: AC
  Administered 2022-08-21 (×3): 10 meq via INTRAVENOUS
  Filled 2022-08-21 (×4): qty 100

## 2022-08-21 MED ORDER — ALBUTEROL SULFATE (2.5 MG/3ML) 0.083% IN NEBU: 2.5000 mg | INHALATION_SOLUTION | RESPIRATORY_TRACT | 12 refills | Status: AC | PRN

## 2022-08-21 MED ORDER — ACETAMINOPHEN 325 MG PO TABS: 650.0000 mg | ORAL_TABLET | Freq: Four times a day (QID) | ORAL | 0 refills | Status: AC | PRN

## 2022-08-21 MED ORDER — METOPROLOL TARTRATE 50 MG PO TABS: 50.0000 mg | ORAL_TABLET | Freq: Two times a day (BID) | ORAL | 3 refills | Status: AC

## 2022-08-21 MED ORDER — POTASSIUM CHLORIDE CRYS ER 20 MEQ PO TBCR
40.0000 meq | EXTENDED_RELEASE_TABLET | ORAL | Status: AC
  Administered 2022-08-21 (×2): 40 meq via ORAL
  Filled 2022-08-21 (×2): qty 2

## 2022-08-21 MED ORDER — SODIUM BICARBONATE 650 MG PO TABS: 650.0000 mg | ORAL_TABLET | Freq: Two times a day (BID) | ORAL | 0 refills | Status: AC

## 2022-08-21 MED ORDER — POTASSIUM CHLORIDE ER 20 MEQ PO TBCR: 20.0000 meq | EXTENDED_RELEASE_TABLET | Freq: Every day | ORAL | 1 refills | Status: AC

## 2022-08-21 MED ORDER — ASPIRIN 81 MG PO TBEC: 81.0000 mg | DELAYED_RELEASE_TABLET | Freq: Every day | ORAL | 3 refills | Status: AC

## 2022-08-21 MED ORDER — INSULIN ASPART 100 UNIT/ML FLEXPEN: 0.0000 [IU] | PEN_INJECTOR | Freq: Three times a day (TID) | SUBCUTANEOUS | 11 refills | Status: AC

## 2022-08-21 MED ORDER — ARFORMOTEROL TARTRATE 15 MCG/2ML IN NEBU: 15.0000 ug | INHALATION_SOLUTION | Freq: Two times a day (BID) | RESPIRATORY_TRACT | 1 refills | Status: AC

## 2022-08-21 MED ORDER — BLOOD GLUCOSE METER KIT: PACK | 3 refills | Status: AC

## 2022-08-21 MED ORDER — LORAZEPAM 1 MG PO TABS: 1.0000 mg | ORAL_TABLET | Freq: Two times a day (BID) | ORAL | 0 refills | Status: DC

## 2022-08-21 MED ORDER — AMLODIPINE BESYLATE 10 MG PO TABS: 10.0000 mg | ORAL_TABLET | Freq: Every day | ORAL | 2 refills | Status: AC

## 2022-08-21 MED ORDER — IPRATROPIUM-ALBUTEROL 0.5-2.5 (3) MG/3ML IN SOLN: 3.0000 mL | Freq: Two times a day (BID) | RESPIRATORY_TRACT | Status: DC

## 2022-08-21 NOTE — Progress Notes (Signed)
Patient slept on and off this shift not complaints of pain. Patient did state "I want something to help me sleep, Trazodone given. Asked patient if she wanted to be reposition in bed patient denise. Continued to monitor patient.

## 2022-08-21 NOTE — Discharge Summary (Addendum)
Leah Wolf, is a 66 y.o. female  DOB Jun 29, 1956  MRN [id number].  Admission date:  08/16/2022  Admitting Physician  [name], MD  Discharge Date:  08/21/2022   Primary MD  [name], MD  Recommendations for primary care physician for things to follow:   1)Avoid ibuprofen/Advil/Aleve/Motrin/Goody Powders/Naproxen/BC powders/Meloxicam/Diclofenac/Indomethacin and other Nonsteroidal anti-inflammatory medications as these will make you more likely to bleed and can cause stomach ulcers, can also cause Kidney problems.   2)Repeat CBC and BMP Blood Tests in 3 to 4 days  3)insulin aspart (novoLOG) injection 0-10 Units 0-14 Units Subcutaneous, 3 times daily with meals CBG < 70: Implement Hypoglycemia Standing Orders and refer to Hypoglycemia Standing Orders sidebar report  CBG 70 - 120: 0 unit CBG 121 - 150: 0 unit  CBG 151 - 200: 2 unit CBG 201 - 250: 4 units CBG 251 - 300: 7 units CBG 301 - 350: 10 units  CBG 351 - 400: 12 units  CBG > 400: 14 units  Admission Diagnosis  DKA (diabetic ketoacidosis) ([place]) [E11.10]   Discharge Diagnosis  DKA (diabetic ketoacidosis) ([place]) [E11.10]    Principal Problem:   DKA (diabetic ketoacidosis) ([place]) Active Problems:   Hyperkalemia   Acute respiratory distress   HTN (hypertension)   Acute renal insufficiency   Pseudohyponatremia   DMII (diabetes mellitus, type 2) (HCC)   HLD (hyperlipidemia)   SARS-CoV-2 antibody positive      Past Medical History:  Diagnosis Date   Acute CVA (cerebrovascular accident) [facility]) June 2014   Anxiety    Carotid artery occlusion    left   Cervical pain (neck)    Hyperlipidemia    Hypertension    Tobacco abuse    Visual aura 2011    Past Surgical History:  Procedure Laterality Date   CAROTID ENDARTERECTOMY Left Aug. 21, 2015   cea   ENDARTERECTOMY Left 04/24/2013   Procedure: ENDARTERECTOMY CAROTID with patch  angioplasty;  Surgeon: Serafina Mitchell, MD;  Location: [facility] OR;  Service: Vascular;  Laterality: Left;   RADIOLOGY WITH ANESTHESIA N/A 02/16/2016   Procedure: STENT PLACEMENT   (RADIOLOGY WITH ANESTHESIA);  Surgeon: Luanne Bras, MD;  Location: [facility];  Service: Radiology;  Laterality: N/A;   TONSILLECTOMY     TUBAL LIGATION       HPI  from the history and physical done on the day of admission:    Brief Narrative:    66 year old female with past medical history relevant for DM2, HLD, HTN, obesity, and left carotid artery stenosis prior carotid endarterectomy in August 2014 and again in 2015, history of prior CVA, anxiety disorder and history of medication noncompliance admitted on 08/16/2022 with DKA in the setting of steroid therapy for COVID infection   08/21/22 -Remains medically stable for discharge to SNF facility  ---Boyfriend states that he is unable to take off patient at home by himself in her current state... He is agreeable to SNF rehab as recommended by physical therapist   -Assessment and Plan: 1)DKA (diabetic  ketoacidosis) ([place]) -DKA--patient met DKA criteria on admission with a bicarb of <7, anion gap of 30, serum glucose of 663 and beta hydroxybutyric acid of >8.0 -Treated with aggressive IV fluids and IV insulin per Endo tool protocol -DKA pathophysiology is resolved -Beta- hydroxybutyric acid is down to 0.8 -Transitioned off IV insulin to subcu insulin -Discharged on Lantus insulin 15 units daily along with NovoLog sliding scale coverage   2)Acute respiratory distress--in the setting of COVID-19 infection/COPD exacerbation -Respiratory status and work of breathing improving with improvement in metabolic acidosis and BiPAP use -In retrospect given severe metabolic acidosis it is not surprising the patient was very tachypneic on admission she was most likely trying to blow off CO2 -Chest x-ray without acute findings -Hold off on steroids given glycemic control on DKA  concerns -Pulmonary critical care consult appreciated =-Patient was treated with Paxlovid -Initially diagnosed with COVID-19 infection over a week ago at [facility] hospital before 08/12/2022 (was discharged from [facility] in [place] on 08/12/2022 with steroids for COVID-19 infection) -Currently on room air no hypoxia or respiratory distress   3)Hyperkalemia -On admission  7.2 -Patient received hyperkalemia cocktail -Potassium normalized with improved glycemic control -Developed hypokalemia eventually during this hospital stay -Patient with history of hypokalemia actually, she will  need potassium supplementation while taking diuretics   4)DMII (diabetes mellitus, type 2) ([place]) - Noncompliant diabetic type II (as per her partner pt had not taken her medications PTA) -- A1c is 14.3 reflecting uncontrolled diabetes with hyperglycemia PTA --Discharged on Lantus insulin 15 units daily along with NovoLog sliding scale coverage Use Novolog/Humalog Sliding scale insulin with Accu-Cheks/Fingersticks as ordered    5)Pseudohyponatremia -Sodium normalized with improved glycemic control   6)AKI----acute kidney injury -on admission creatinine was up to 2.35 in the setting of DKA/volume depletion -Creatinine normalized with hydration - renally adjust medications, avoid nephrotoxic agents / dehydration  / hypotension -Need to hold lisinopril/HCTZ   7)HTN (hypertension) -BP trending up, add amlodipine 10 mg daily Hold lisinopril HCTZ as above #6 -IV hydralazine as needed elevated BP -Increase metoprolol to 50 twice daily   8)SARS-CoV-2 antibody positive - Per patient was recently diagnosed with SARS-CoV-2 as an outpatient -Inpatient screening SARS-CoV-2 positive -Negative influenza A/B -As an outpatient was treated with oral antibiotics, steroids Decadron -Continue molnupiravir -Check x-ray without acute findings, currently on room air -Initially diagnosed with COVID-19 infection over a  week ago at [facility] hospital before 08/12/2022 (was discharged from [facility] [place] on 08/12/2022 with steroids for COVID-19 infection) -Currently on room air no hypoxia or respiratory distress   9)Carotid stenosis/left carotid artery stenosis prior carotid endarterectomy in August 2014 and again in 2015, history of prior CVA, - -Continue Lipitor and Plavix for secondary stroke prophylaxis   10)Generalized weakness and deconditioning----PT recommends SNF rehab 08/21/22 -Remains medically stable for discharge to SNF facility   11) chronic anemia--- Hgb around 10 -No bleeding concerns at this time -Repeat CBC within a week  Disposition: The patient is from: Home              Anticipated d/c is to: SNF        Code Status :  -  Code Status: Full Code    Family Communication:    Discussed with boyfriend AT BEDSIDE   Discharge Condition: stable  Follow UP   Contact information for follow-up providers     [facility] [place] Follow up.   Why: RN/PT will call to schedule your home visit. Contact information:  [place] [address] [phone]              Contact information for after-discharge care     [facility] .   Service: Skilled Nursing Contact information: [place] [phone]                     Consults obtained -pulmonary critical care specialist  Diet and Activity recommendation:  As advised  Discharge Instructions    Discharge Instructions     Ambulatory referral to Nutrition and Diabetic Education   Complete by: As directed    Call MD for:  difficulty breathing, headache or visual disturbances   Complete by: As directed    Call MD for:  persistant dizziness or light-headedness   Complete by: As directed    Call MD for:  persistant nausea and vomiting   Complete by: As directed    Call MD for:  temperature >100.4   Complete  by: As directed    Diet - low sodium heart healthy   Complete by: As directed    Diet Carb Modified   Complete by: As directed    Discharge instructions   Complete by: As directed    1)Avoid ibuprofen/Advil/Aleve/Motrin/Goody Powders/Naproxen/BC powders/Meloxicam/Diclofenac/Indomethacin and other Nonsteroidal anti-inflammatory medications as these will make you more likely to bleed and can cause stomach ulcers, can also cause Kidney problems.   2)Repeat CBC and BMP Blood Tests in 3 to 4 days  3)insulin aspart (novoLOG) injection 0-10 Units 0-14 Units Subcutaneous, 3 times daily with meals CBG < 70: Implement Hypoglycemia Standing Orders and refer to Hypoglycemia Standing Orders sidebar report  CBG 70 - 120: 0 unit CBG 121 - 150: 0 unit  CBG 151 - 200: 2 unit CBG 201 - 250: 4 units CBG 251 - 300: 7 units CBG 301 - 350: 10 units  CBG 351 - 400: 12 units  CBG > 400: 14 units   Increase activity slowly   Complete by: As directed          Discharge Medications     Allergies as of 08/21/2022       Reactions   Percocet [oxycodone-acetaminophen] Anxiety   Morphine And Related Other (See Comments)   Reaction unknown        Medication List     STOP taking these medications    azithromycin 600 MG tablet Commonly known as: ZITHROMAX   cefdinir 300 MG capsule Commonly known as: OMNICEF   dexamethasone 4 MG tablet Commonly known as: DECADRON       TAKE these medications    acetaminophen 325 MG tablet Commonly known as: TYLENOL Take 2 tablets (650 mg total) by mouth every 6 (six) hours as needed for mild pain, fever or headache (or Fever >/= 101).   albuterol (2.5 MG/3ML) 0.083% nebulizer solution Commonly known as: PROVENTIL Take 3 mLs (2.5 mg total) by nebulization every 4 (four) hours as needed for wheezing or shortness of breath.   amLODipine 10 MG tablet Commonly known as: NORVASC Take 1 tablet (10 mg total) by mouth daily. For BP Start taking on: August 22, 2022   arformoterol 15 MCG/2ML Nebu Commonly known as: BROVANA Take 2 mLs (15 mcg total) by nebulization 2 (two) times daily.   ascorbic acid 500 MG tablet Commonly known as: VITAMIN C Take 1 tablet (500 mg total) by mouth daily. Start taking on: August 22, 2022   aspirin EC 81 MG tablet Take 1 tablet (81 mg total) by mouth daily with breakfast. What changed: when to take this   atorvastatin 40 MG tablet Commonly known as: LIPITOR Take 40 mg by mouth daily.   blood glucose meter kit and supplies Relion Prime or Dispense other brand based on patient and insurance preference. Use up to four times daily as directed. (FOR ICD-9 250.00, 250.01).   clopidogrel 75 MG tablet Commonly known as: PLAVIX Take 75 mg by mouth daily.   insulin aspart 100 UNIT/ML FlexPen Commonly known as: NOVOLOG Inject 0-14 Units into the skin 3 (three) times daily with meals. insulin aspart (novoLOG) injection 0-10 Units 0-14 Units Subcutaneous, 3 times daily with meals CBG < 70: Implement Hypoglycemia Standing Orders and refer to Hypoglycemia Standing Orders sidebar report  CBG 70 - 120: 0 unit CBG 121 - 150: 0 unit  CBG 151 - 200: 2 unit CBG 201 - 250: 4 units CBG 251 - 300: 7 units CBG 301 - 350: 10 units  CBG 351 - 400: 12 units  CBG > 400: 14 units   insulin glargine-yfgn 100 UNIT/ML Pen Commonly known as: SEMGLEE Inject 15 Units into the skin daily.   insulin glargine-yfgn 100 UNIT/ML injection Commonly known as: SEMGLEE Inject 0.15 mLs (15 Units total) into the skin daily. Start taking on: August 22, 2022   lisinopril-hydrochlorothiazide 20-12.5 MG tablet Commonly known as: ZESTORETIC Take 1 tablet by mouth daily.   LORazepam 1 MG tablet Commonly known as: ATIVAN Take 1 tablet (1 mg total) by mouth 2 (two) times daily. What changed:  medication strength how much to take when to take this   metoprolol tartrate 50 MG tablet Commonly known as: LOPRESSOR Take 1 tablet (50 mg total) by  mouth 2 (two) times daily. What changed:  medication strength how much to take   Potassium Chloride ER 20 MEQ Tbcr Take 20 mEq by mouth daily. Please Take while Taking HCTZ   senna-docusate 8.6-50 MG tablet Commonly known as: Senokot-S Take 2 tablets by mouth at bedtime.   sodium bicarbonate 650 MG tablet Take 1 tablet (650 mg total) by mouth 2 (two) times daily.   tiotropium 18 MCG inhalation capsule Commonly known as: SPIRIVA Place 18 mcg into inhaler and inhale daily.   Trelegy Ellipta 200-62.5-25 MCG/ACT Aepb Generic drug: Fluticasone-Umeclidin-Vilant Take 1 puff by mouth daily.   zinc sulfate 220 (50 Zn) MG capsule Take 1 capsule (220 mg total) by mouth daily. Start taking on: August 22, 2022        Major procedures and Radiology Reports - PLEASE review detailed and final reports for all details, in brief -   CT HEAD WO CONTRAST (5MM)  Result Date: 08/16/2022 CLINICAL DATA:  Altered mental status EXAM: CT HEAD WITHOUT CONTRAST TECHNIQUE: Contiguous axial images were obtained from the base of the skull through the vertex without intravenous contrast. RADIATION DOSE REDUCTION: This exam was performed according to the departmental dose-optimization program which includes automated exposure control, adjustment of the mA and/or kV according to patient size and/or use of iterative reconstruction technique. COMPARISON:  None Available. FINDINGS: Brain: There is no mass, hemorrhage or extra-axial collection. The size and configuration of the ventricles and extra-axial CSF spaces are normal. Left parietal-occipital encephalomalacia and asymmetric volume loss. Vascular: No abnormal hyperdensity of the major intracranial arteries or dural venous sinuses. No intracranial atherosclerosis. Skull: The visualized skull base, calvarium and extracranial soft tissues are normal. Sinuses/Orbits: No fluid  levels or advanced mucosal thickening of the visualized paranasal sinuses. No mastoid or  middle ear effusion. The orbits are normal. IMPRESSION: 1. No acute intracranial abnormality. 2. Left parieto-occipital encephalomalacia and asymmetric volume loss. Electronically Signed   By: Ulyses Jarred M.D.   On: 08/16/2022 21:42   DG Chest Port 1 View  Result Date: 08/16/2022 CLINICAL DATA:  Short of breath, hypertension EXAM: PORTABLE CHEST 1 VIEW COMPARISON:  08/16/2022 FINDINGS: Single frontal view of the chest demonstrates an unremarkable cardiac silhouette. No airspace disease, effusion, or pneumothorax. There are no acute bony abnormalities. IMPRESSION: 1. No acute intrathoracic process. Electronically Signed   By: Randa Ngo M.D.   On: 08/16/2022 16:58   DG Chest Port 1 View  Result Date: 08/16/2022 CLINICAL DATA:  Shortness of breath EXAM: PORTABLE CHEST 1 VIEW COMPARISON:  04/21/2013 FINDINGS: The heart size and mediastinal contours are within normal limits. No focal airspace consolidation, pleural effusion, or pneumothorax. The visualized skeletal structures are unremarkable. IMPRESSION: No active disease. Electronically Signed   By: Davina Poke D.O.   On: 08/16/2022 12:44    Micro Results   Recent Results (from the past 240 hour(s))  Resp panel by RT-PCR (RSV, Flu A&B, Covid) Anterior Nasal Swab     Status: Abnormal   Collection Time: 08/16/22  1:34 PM   Specimen: Anterior Nasal Swab  Result Value Ref Range Status   SARS Coronavirus 2 by RT PCR POSITIVE (A) NEGATIVE Final    Comment: (NOTE) SARS-CoV-2 target nucleic acids are DETECTED.  The SARS-CoV-2 RNA is generally detectable in upper respiratory specimens during the acute phase of infection. Positive results are indicative of the presence of the identified virus, but do not rule out bacterial infection or co-infection with other pathogens not detected by the test. Clinical correlation with patient history and other diagnostic information is necessary to determine patient infection status. The expected result  is Negative.  Fact Sheet for Patients: EntrepreneurPulse.com.au  Fact Sheet for Healthcare Providers: IncredibleEmployment.be  This test is not yet approved or cleared by the [place] FDA and  has been authorized for detection and/or diagnosis of SARS-CoV-2 by FDA under an Emergency Use Authorization (EUA).  This EUA will remain in effect (meaning this test can be used) for the duration of  the COVID-19 declaration under Section 564(b)(1) of the A ct, 21 U.S.C. section 360bbb-3(b)(1), unless the authorization is terminated or revoked sooner.     Influenza A by PCR NEGATIVE NEGATIVE Final   Influenza B by PCR NEGATIVE NEGATIVE Final    Comment: (NOTE) The Xpert Xpress SARS-CoV-2/FLU/RSV plus assay is intended as an aid in the diagnosis of influenza from Nasopharyngeal swab specimens and should not be used as a sole basis for treatment. Nasal washings and aspirates are unacceptable for Xpert Xpress SARS-CoV-2/FLU/RSV testing.  Fact Sheet for Patients: EntrepreneurPulse.com.au  Fact Sheet for Healthcare Providers: IncredibleEmployment.be  This test is not yet approved or cleared by the [place] FDA and has been authorized for detection and/or diagnosis of SARS-CoV-2 by FDA under an Emergency Use Authorization (EUA). This EUA will remain in effect (meaning this test can be used) for the duration of the COVID-19 declaration under Section 564(b)(1) of the Act, 21 U.S.C. section 360bbb-3(b)(1), unless the authorization is terminated or revoked.     Resp Syncytial Virus by PCR NEGATIVE NEGATIVE Final    Comment: (NOTE) Fact Sheet for Patients: EntrepreneurPulse.com.au  Fact Sheet for Healthcare Providers: IncredibleEmployment.be  This test is not yet approved or  cleared by the [place] and has been authorized for detection and/or diagnosis of SARS-CoV-2  by FDA under an Emergency Use Authorization (EUA). This EUA will remain in effect (meaning this test can be used) for the duration of the COVID-19 declaration under Section 564(b)(1) of the Act, 21 U.S.C. section 360bbb-3(b)(1), unless the authorization is terminated or revoked.  Performed at [facility], [address], [place], [place] [address]   Blood Culture (routine x 2)     Status: None   Collection Time: 08/16/22  1:43 PM   Specimen: Site Not Specified; Blood  Result Value Ref Range Status   Specimen Description SITE NOT SPECIFIED  Final   Special Requests   Final    BOTTLES DRAWN AEROBIC AND ANAEROBIC Blood Culture adequate volume   Culture   Final    NO GROWTH 5 DAYS Performed at [facility], [address] [place], [place] [address]    Report Status 08/21/2022 FINAL  Final  MRSA Next Gen by PCR, Nasal     Status: None   Collection Time: 08/16/22  6:00 PM   Specimen: Nasal Mucosa; Nasal Swab  Result Value Ref Range Status   MRSA by PCR Next Gen NOT DETECTED NOT DETECTED Final    Comment: (NOTE) The GeneXpert MRSA Assay (FDA approved for NASAL specimens only), is one component of a comprehensive MRSA colonization surveillance program. It is not intended to diagnose MRSA infection nor to guide or monitor treatment for MRSA infections. Test performance is not FDA approved in patients less than 92 years old. Performed at [facility], [address] [place], [place] [address]   Urine Culture     Status: Abnormal   Collection Time: 08/16/22  6:40 PM   Specimen: Urine, Clean Catch  Result Value Ref Range Status   Specimen Description   Final    URINE, CLEAN CATCH Performed at [facility], [address], [place], [place] [address]    Special Requests   Final    NONE Performed at [facility], [address], [place], [place] [address]    Culture (A)  Final    <10,000 COLONIES/mL INSIGNIFICANT GROWTH Performed at [facility], [place] [address], [place],  [place] [address]    Report Status 08/17/2022 FINAL  Final    Today   Subjective    Donette Larry today has no new complaints -Oral intake is fair -Boyfriend at bedside No fever  Or chills   No Nausea, Vomiting or Diarrhea          Patient has been seen and examined prior to discharge   Objective   Blood pressure (!) 146/66, pulse 89, temperature 98.2 F (36.8 C), resp. rate 20, height _0  (1.626 m), weight 87 kg, SpO2 95 %.   Intake/Output Summary (Last 24 hours) at 08/21/2022 1233 Last data filed at 08/21/2022 0530 Gross per 24 hour  Intake --  Output 400 ml  Net -400 ml    Exam Gen:- Awake Alert,  in no apparent distress , cooperative HEENT:- .AT, No sclera icterus Neck-Supple Neck,No JVD,.  Lungs-  improved air movement, no wheezing at this time CV- S1, S2 normal, regular  Abd-  +ve B.Sounds, Abd Soft, No tenderness, increased truncal adiposity Extremity/Skin:- No  edema, pedal pulses present  Psych-patient episodes of confusion and disorientation, as per her boyfriend this happens at times  neuro-generalized weakness, no new focal deficits, no tremors   Data Review   CBC w Diff:  Lab Results  Component Value Date   WBC 5.7 08/21/2022   HGB 9.9 (L) 08/21/2022   HCT 29.8 (L) 08/21/2022   PLT 198 08/21/2022   LYMPHOPCT 5 08/16/2022   MONOPCT 10 08/16/2022   EOSPCT 0 08/16/2022   BASOPCT 0 08/16/2022    CMP:  Lab Results  Component Value Date   NA 145 08/21/2022   K 2.9 (L) 08/21/2022   CL 116 (H) 08/21/2022   CO2 22 08/21/2022   BUN 12 08/21/2022   CREATININE 0.70 08/21/2022   CREATININE 0.64 04/14/2013   PROT 7.3 08/16/2022   ALBUMIN 2.5 (L) 08/17/2022   BILITOT 1.3 (H) 08/16/2022   ALKPHOS 88 08/16/2022   AST 70 (H) 08/16/2022   ALT 82 (H) 08/16/2022  .  Total Discharge time is about 33 minutes  Roxan Hockey M.D on 08/21/2022 at 12:33 PM  Go to [email or web address] -  for contact info  [facility] - Office  [phone]

## 2022-08-21 NOTE — TOC Transition Note (Signed)
Transition of Care [facility]) - CM/SW Discharge Note   Patient Details  Name: Leah Wolf MRN: [id number] Date of Birth: 1956-06-11  Transition of Care [facility]) CM/SW Contact:  Annice Needy, LCSW Phone Number: 08/21/2022, 12:27 PM   Clinical Narrative:    D/c Clinicals sent to facility. Nurse to call report. [facility] transport contacted. No transport avail until 3 p.m. TOC signing off.    Final next level of care: Skilled Nursing Facility Barriers to Discharge: Barriers Resolved   Patient Goals and CMS Choice Patient states their goals for this hospitalization and ongoing recovery are:: to go home. CMS Medicare.gov Compare Post Acute Care list provided to:: Patient Choice offered to / list presented to : Patient  Discharge Placement              Patient [facility] Patient to be transferred to facility by: [facility] Name of family member notified: friend, Roe Coombs Patient and family notified of of transfer: 08/21/22  Discharge Plan and Services                            HH Agency: [facility] Date HH Agency Contacted: 08/18/22 Time HH Agency Contacted: 1316 Representative spoke with at [facility] Agency: Emailing orders and records  Social Determinants of Health (SDOH) Interventions     Readmission Risk Interventions    08/17/2022    1:14 PM  Readmission Risk Prevention Plan  Transportation Screening Complete  PCP or Specialist Appt within 3-5 Days Not Complete  HRI or Home Care Consult Complete  Social Work Consult for Recovery Care Planning/Counseling Complete  Palliative Care Screening Not Applicable  Medication Review [facility]) Complete

## 2022-08-21 NOTE — Care Management Important Message (Addendum)
Important Message  Patient Details  Name: Leah Wolf MRN: [id number] Date of Birth: [birth year]   Medicare Important Message Given:  Yes   Copy given to spouse, Roe Coombs, in room  Corey Harold 08/21/2022, 12:06 PM

## 2022-08-21 NOTE — Discharge Instructions (Signed)
1)Avoid ibuprofen/Advil/Aleve/Motrin/Goody Powders/Naproxen/BC powders/Meloxicam/Diclofenac/Indomethacin and other Nonsteroidal anti-inflammatory medications as these will make you more likely to bleed and can cause stomach ulcers, can also cause Kidney problems.   2)Repeat CBC and BMP Blood Tests in 3 to 4 days  3)insulin aspart (novoLOG) injection 0-10 Units 0-14 Units Subcutaneous, 3 times daily with meals CBG < 70: Implement Hypoglycemia Standing Orders and refer to Hypoglycemia Standing Orders sidebar report  CBG 70 - 120: 0 unit CBG 121 - 150: 0 unit  CBG 151 - 200: 2 unit CBG 201 - 250: 4 units CBG 251 - 300: 7 units CBG 301 - 350: 10 units  CBG 351 - 400: 12 units  CBG > 400: 14 units

## 2022-08-22 ENCOUNTER — Emergency Department (HOSPITAL_COMMUNITY)
Admission: EM | Admit: 2022-08-22 | Discharge: 2022-08-22 | Discharge status: Against Medical Advice | Payer: Medicare Other | Attending: Emergency Medicine | Admitting: Emergency Medicine

## 2022-08-22 ENCOUNTER — Other Ambulatory Visit: Payer: Self-pay

## 2022-08-22 ENCOUNTER — Encounter (HOSPITAL_COMMUNITY): Payer: Self-pay

## 2022-08-22 DIAGNOSIS — Z760 Encounter for issue of repeat prescription: Secondary | ICD-10-CM | POA: Diagnosis present

## 2022-08-22 DIAGNOSIS — Z5321 Procedure and treatment not carried out due to patient leaving prior to being seen by health care provider: Secondary | ICD-10-CM | POA: Insufficient documentation

## 2022-08-22 LAB — CBG MONITORING, ED: Glucose-Capillary: 149 mg/dL — ABNORMAL HIGH (ref 70–99)

## 2022-08-22 NOTE — ED Triage Notes (Addendum)
Pt was discharged from hospital to SNF [facility]), pt signed herself out, however she does not have any insulin or supplies to take care of her at home. Significant other is in need of home health to help take care of her. [facility] home [facility] informed can not help patient unless they restart whole process.

## 2022-08-25 NOTE — Progress Notes (Addendum)
08/25/22 1155am  Sig other called and stated no one instructed them on dc instructions and needed to know how much insulin to give. Went over [employer] with him and pt then stated he understood. When asked if he knew how to give the insulin shots, pt stated he had been looking at [employer]. Advised that dc papers also has instructions of how and where to give the insulin shots and advised should be in the skin. Significant other agreed.

## 2023-03-27 ENCOUNTER — Telehealth (HOSPITAL_COMMUNITY): Payer: Self-pay

## 2023-03-27 ENCOUNTER — Other Ambulatory Visit (HOSPITAL_COMMUNITY): Payer: Self-pay | Admitting: Interventional Radiology

## 2023-03-27 DIAGNOSIS — I6521 Occlusion and stenosis of right carotid artery: Secondary | ICD-10-CM

## 2023-03-27 NOTE — Telephone Encounter (Signed)
Called to reschedule consult,no answer, no vm. AB

## 2023-04-09 ENCOUNTER — Ambulatory Visit (HOSPITAL_COMMUNITY)
Admission: RE | Admit: 2023-04-09 | Discharge: 2023-04-09 | Disposition: A | Discharge status: Home / Self Care | Payer: Medicare Other | Source: Ambulatory Visit | Attending: Interventional Radiology | Admitting: Interventional Radiology

## 2023-04-09 DIAGNOSIS — I6521 Occlusion and stenosis of right carotid artery: Secondary | ICD-10-CM

## 2023-04-10 HISTORY — PX: IR RADIOLOGIST EVAL & MGMT: IMG5224

## 2023-04-17 ENCOUNTER — Telehealth (HOSPITAL_COMMUNITY): Payer: Self-pay

## 2023-04-17 ENCOUNTER — Other Ambulatory Visit (HOSPITAL_COMMUNITY): Payer: Self-pay | Admitting: Interventional Radiology

## 2023-04-17 DIAGNOSIS — I6521 Occlusion and stenosis of right carotid artery: Secondary | ICD-10-CM

## 2023-04-17 NOTE — Telephone Encounter (Signed)
Called to schedule cta, no answer, left vm. AB 

## 2023-04-25 ENCOUNTER — Ambulatory Visit (HOSPITAL_COMMUNITY)
Admission: RE | Admit: 2023-04-25 | Discharge: 2023-04-25 | Disposition: A | Discharge status: Home / Self Care | Payer: Medicare Other | Source: Ambulatory Visit | Attending: Interventional Radiology | Admitting: Interventional Radiology

## 2023-04-25 DIAGNOSIS — I6521 Occlusion and stenosis of right carotid artery: Secondary | ICD-10-CM | POA: Insufficient documentation

## 2023-04-25 LAB — POCT I-STAT CREATININE: Creatinine, Ser: 0.9 mg/dL (ref 0.44–1.00)

## 2023-04-25 MED ORDER — IOHEXOL 350 MG/ML SOLN
75.0000 mL | Freq: Once | INTRAVENOUS | Status: AC | PRN
  Administered 2023-04-25: 75 mL via INTRAVENOUS

## 2023-05-02 ENCOUNTER — Telehealth (HOSPITAL_COMMUNITY): Payer: Self-pay

## 2023-05-02 ENCOUNTER — Other Ambulatory Visit (HOSPITAL_COMMUNITY): Payer: Self-pay | Admitting: Interventional Radiology

## 2023-05-02 DIAGNOSIS — H93A9 Pulsatile tinnitus, unspecified ear: Secondary | ICD-10-CM

## 2023-05-02 DIAGNOSIS — I6521 Occlusion and stenosis of right carotid artery: Secondary | ICD-10-CM

## 2023-05-02 NOTE — Telephone Encounter (Signed)
Called to schedule consult, no answer, left vm. AB  

## 2023-12-11 ENCOUNTER — Telehealth (HOSPITAL_COMMUNITY): Payer: Self-pay

## 2023-12-11 NOTE — Telephone Encounter (Signed)
 Pt's friend called to cancel cta. They don't want to reschedule at this time but will call back when they are ready. AB

## 2023-12-14 ENCOUNTER — Ambulatory Visit (HOSPITAL_COMMUNITY)

## 2024-02-14 ENCOUNTER — Telehealth (HOSPITAL_COMMUNITY): Payer: Self-pay

## 2024-02-14 NOTE — Telephone Encounter (Signed)
-----   Message from Kacie Sue-Ellen Matthews sent at 04/25/2023  3:57 PM EDT ----- Per Dr. Alvira Josephs, no immediate cause of her pulsatile tinnitus.  It may be due to higher venous return/flow on the right side compared to left due to her prior stroke.  Recommends repeat CTA in 6 months  Kacie

## 2024-03-01 ENCOUNTER — Encounter (HOSPITAL_COMMUNITY): Payer: Self-pay | Admitting: Interventional Radiology
